# Patient Record
Sex: Male | Born: 1985 | Race: White | Hispanic: No | Marital: Single | Smoking: Never smoker
Health system: Southern US, Community
[De-identification: ages and names within clinical notes are randomized; demographics above are authoritative.]

## PROBLEM LIST (undated history)

## (undated) DIAGNOSIS — R569 Unspecified convulsions: Secondary | ICD-10-CM

## (undated) DIAGNOSIS — F79 Unspecified intellectual disabilities: Secondary | ICD-10-CM

## (undated) HISTORY — DX: Unspecified convulsions: R56.9

## (undated) HISTORY — DX: Unspecified intellectual disabilities: F79

## (undated) HISTORY — PX: FINGER SURGERY: SHX640

---
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
## 2009-11-06 ENCOUNTER — Encounter: Admission: RE | Admit: 2009-11-06 | Discharge: 2009-11-06 | Payer: Self-pay | Admitting: Orthopedic Surgery

## 2009-12-22 ENCOUNTER — Encounter: Admission: RE | Admit: 2009-12-22 | Discharge: 2010-03-22 | Payer: Self-pay | Admitting: Orthopedic Surgery

## 2010-09-09 IMAGING — CT CT L SPINE W/ CM
4 of 9 series · 13 of 29 positions shown, 14 images · IV contrast (omnipaque)
Comparison: None available.

CLINICAL DATA: Low back and right lower extremity pain.

MYELOGRAM INJECTION
TECHNIQUE: Informed consent was obtained from the patient prior to
the procedure, including potential complications of headache,
allergy, infection and pain.  A timeout procedure was performed.
With the patient prone, the lower back was prepped with Betadine.
1% Lidocaine was used for local anesthesia.  Lumbar puncture was
performed at the right paramidline L2-3 level using a 22 gauge
needle with return of clear CSF.  10 ml of Omnipaque 225was
injected into the subarachnoid space .
TECHNIQUE: Following injection of intrathecal Omnipaque contrast,
spine imaging in multiple projections was performed using
fluoroscopy.
Fluoroscopy Time: 57 seconds .
TECHNIQUE: CT imaging of the lumbar spine was performed after
intrathecal contrast administration.  Multiplanar CT image
reconstructions were also generated.

[Series 2: l spine bone · axial · 0.27mm/px · z∈[-308,-184]mm · 3 of 100 slices shown, 4 images]
[im 25/100  soft-tissue]
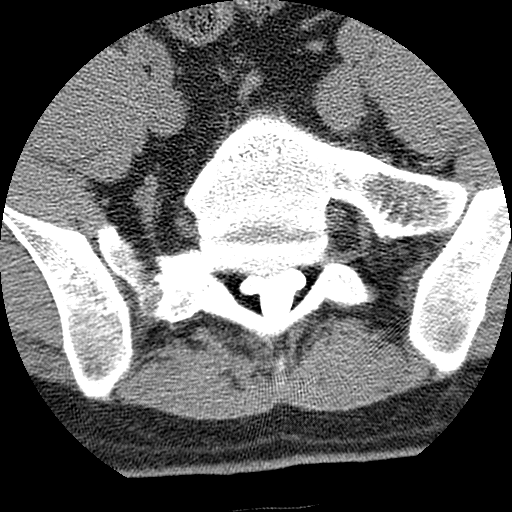
[im 25/100  bone]
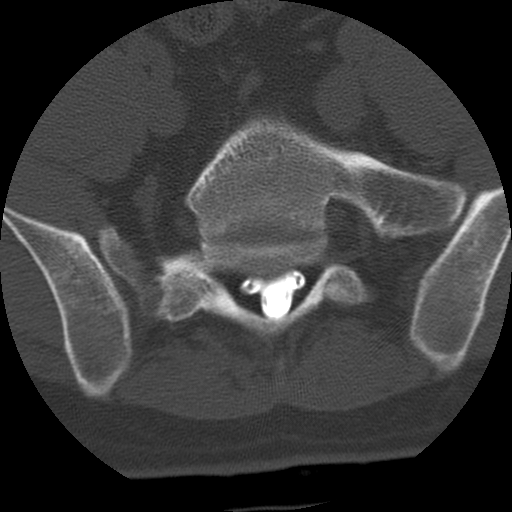
[im 50/100  bone]
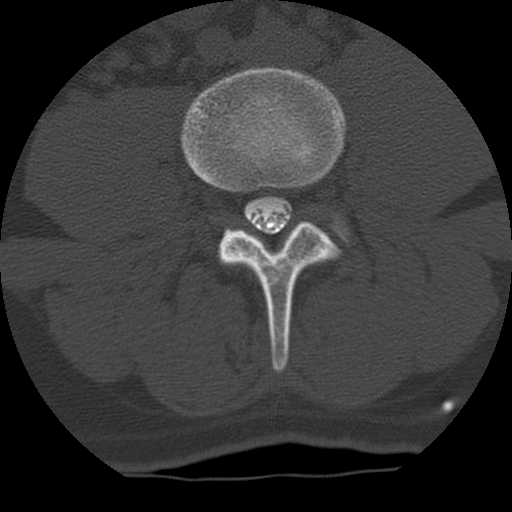
[im 75/100  bone]
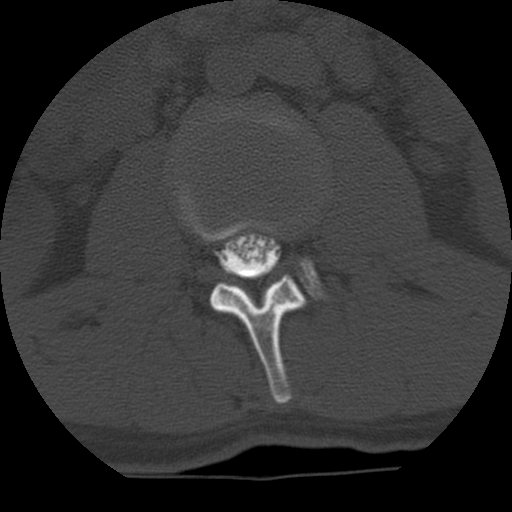

[Series 3: l spine soft · axial · 0.27mm/px · z∈[-308,-184]mm · 3 of 99 slices shown]
[im 25/99  soft-tissue]
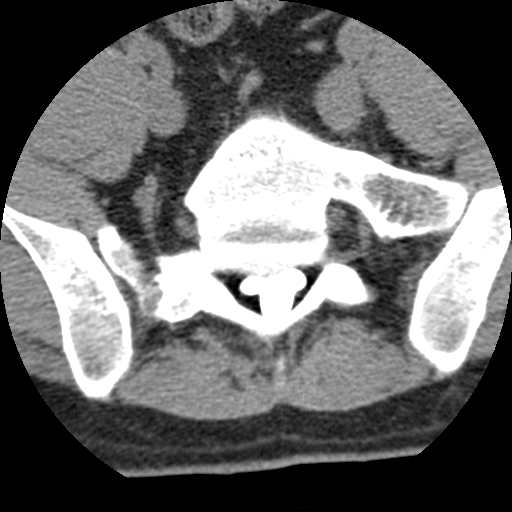
[im 50/99  soft-tissue]
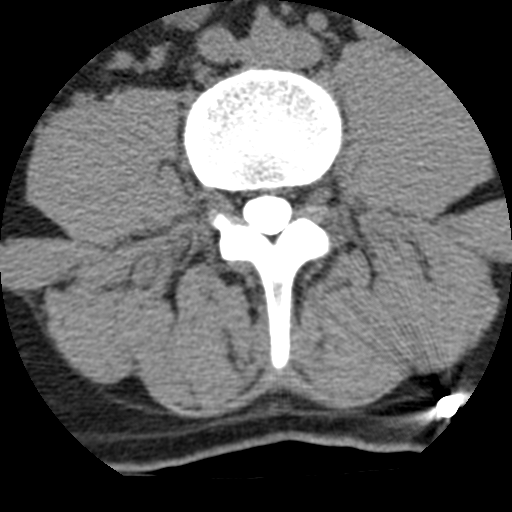
[im 74/99  soft-tissue]
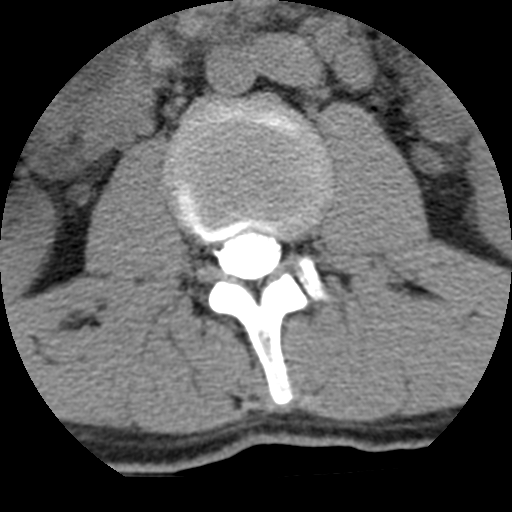

[Series 400: cor · coronal · 0.49mm/px · 6 of 40 slices shown]
[im 2/40  soft-tissue]
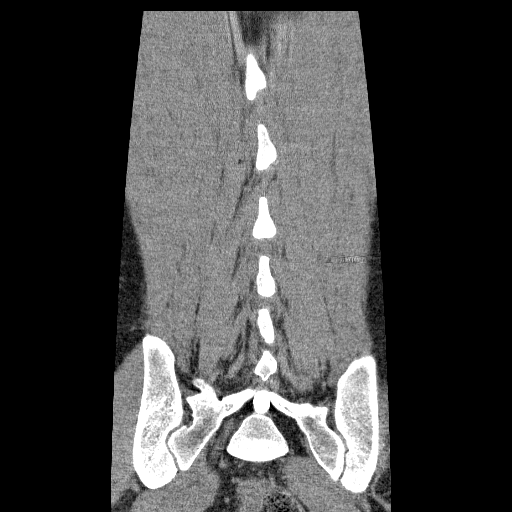
[im 7/40  bone]
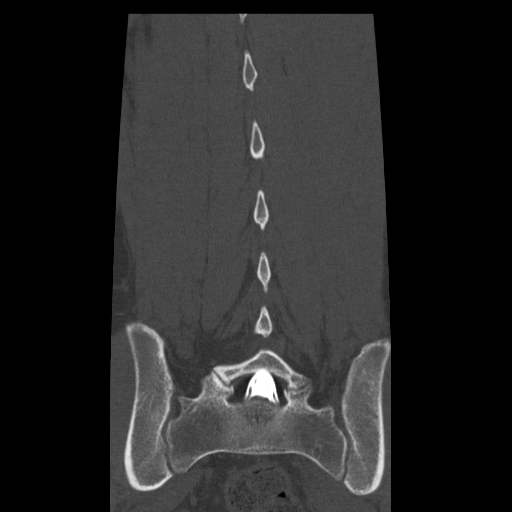
[im 14/40  bone]
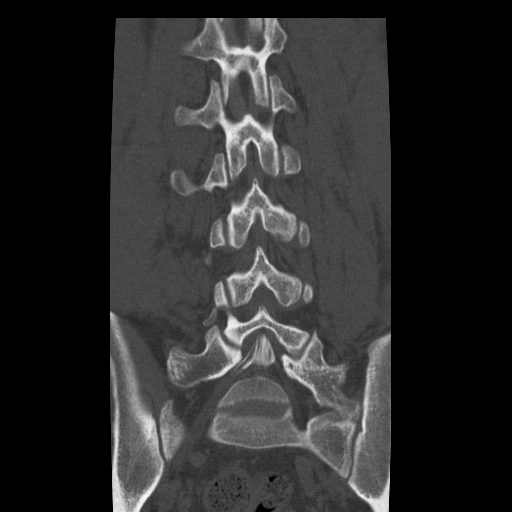
[im 20/40  bone]
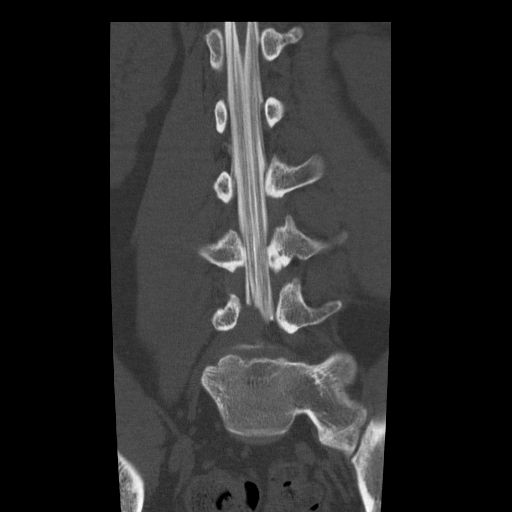
[im 27/40  bone]
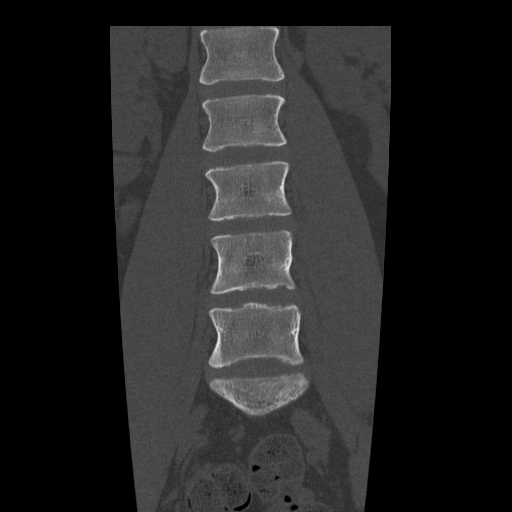
[im 33/40  bone]
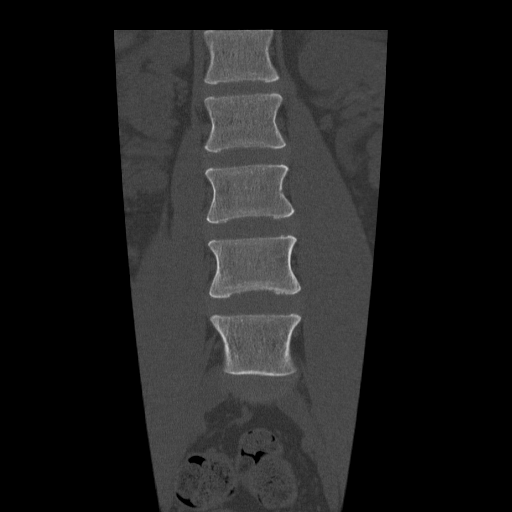

[Series 401: cor/lower level · coronal · 0.27mm/px · 1 of 40 slices shown]
[im 39/40  bone]
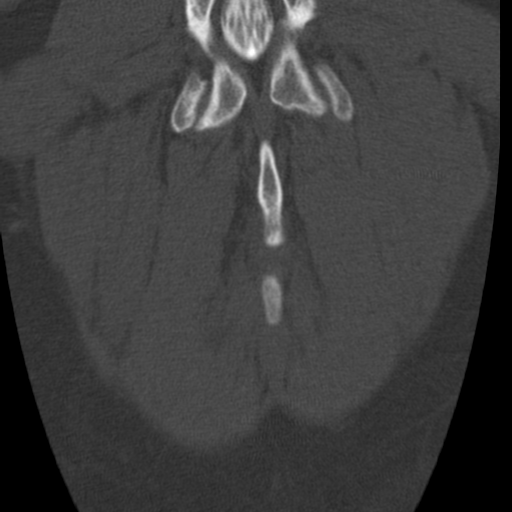

[13 of 29 positions shown; findings below may reference images not displayed]

IMPRESSION: Successful injection of  intrathecal contrast for myelography.

MYELOGRAM LUMBAR
FINDINGS: Five lumbar-type vertebral bodies are present.  A
transitional S1 segment is present.  There is a large filling
defect posterior to the L5 vertebral body.  There is abrupt cutoff
of the right L5 nerve root at the L4-5 level.  There is significant
medial displacement of the right S1 nerve root.  The upper nerve
roots fill normally.  The left sided nerve roots fill normally.
The vertebral body heights and alignment are maintained.

Standing images demonstrate no significant change in alignment.
Alignment is stable through flexion and extension.
IMPRESSION: 1.  Large right lateral filling defect posterior to the L5
vertebral body creating significant mass effect on the right L5 and
S1 nerve roots.
2.  Transitional S1 segment.

CT MYELOGRAPHY LUMBAR SPINE
FINDINGS: As stated above, there is a transitional S1 segment.
The conus medullaris terminates the vertebral body heights
alignment are maintained.  The conus medullaris terminates at the
L1 level.  The disc levels at L3-4 and above are normal.

L4-5:  Rightward disc bulging is present.  There is mild facet
hypertrophy and ligamentum flavum thickening.  Mild right foraminal
narrowing is evident.  There is significant right lateral recess
narrowing just below the disc level.  It is unclear if this
represents a disc fragment from the L4-5 level or from the L5-S1
level.

L5-S1:  Broad-based disc bulging is present with significant right
lateral recess component.  There is significant displacement of the
right S1 nerve root.  The disc herniates into the foramen, creating
significant foraminal stenosis as well.  Extensive soft tissue
posterior to the L5 vertebral body on the right likely represents
extruded disc material extending superiorly from this L5-S1 disc.
Mild left foraminal narrowing is present as well.
IMPRESSION: 1.  Large soft tissue filling defect posterior to the L5 vertebral
body creating significant mass effect on the right L5 and S1 nerve
roots.  This most likely represents extruded disc material from the
L5-S1 level.
2.  Rightward disc bulging at L4-5 also narrows the right lateral
recess at that level.
3.  Moderate right foraminal stenosis at L5-S1.
4.  Transitional S1 anatomy.

## 2011-02-05 NOTE — Procedures (Signed)
REFERRING PHYSICIAN:  Ellison Carwin, M.D.   CLINICAL HISTORY:  [age]-year-old male with history of seizure 2 weeks  ago.  Medications not listed.   No clear background awake rhythm is seen.  There is a diffuse 6-7 Hz theta  slowing seen throughout the recording.  The patient does show some changes  of light sleep during the tracing but no clear wakeful pattern is seen.  No  paroxysmal epileptiform activity spikes or sharp waves identified.  Length  of the tracing is 22.3 minutes, technical component is adequate, EKG tracing  reveals regular sinus rhythm.  Hyperventilation and photic stimulation  result in no significant abnormalities.   IMPRESSION:  This EEG is slightly abnormal due to presence of excessive  sleepiness.  No definitive epileptiform features are however, noted.      ZOX:WRUE  D:  02/03/2005 19:34:57  T:  02/03/2005 20:43:03  Job #:  [id number]   cc:   Deanna Artis. Sharene Skeans, M.D.  [address]  [address]  [place]  [place] [address]  Fax: [phone]

## 2013-01-26 ENCOUNTER — Other Ambulatory Visit: Payer: Self-pay

## 2013-01-26 MED ORDER — LAMOTRIGINE 100 MG PO TABS: 200.0000 mg | ORAL_TABLET | Freq: Two times a day (BID) | ORAL | Status: DC

## 2013-01-26 MED ORDER — CARBAMAZEPINE ER 200 MG PO CP12: 400.0000 mg | ORAL_CAPSULE | Freq: Two times a day (BID) | ORAL | Status: DC

## 2013-02-06 ENCOUNTER — Other Ambulatory Visit: Payer: Self-pay

## 2013-02-06 MED ORDER — LAMOTRIGINE 100 MG PO TABS: 200.0000 mg | ORAL_TABLET | Freq: Two times a day (BID) | ORAL | Status: DC

## 2013-02-06 MED ORDER — CARBAMAZEPINE ER 200 MG PO CP12: 400.0000 mg | ORAL_CAPSULE | Freq: Two times a day (BID) | ORAL | Status: DC

## 2016-07-28 ENCOUNTER — Encounter (INDEPENDENT_AMBULATORY_CARE_PROVIDER_SITE_OTHER): Payer: Self-pay

## 2016-07-28 ENCOUNTER — Ambulatory Visit (INDEPENDENT_AMBULATORY_CARE_PROVIDER_SITE_OTHER): Payer: Medicaid Other

## 2016-07-28 DIAGNOSIS — Z23 Encounter for immunization: Secondary | ICD-10-CM | POA: Diagnosis not present

## 2016-09-22 ENCOUNTER — Ambulatory Visit (INDEPENDENT_AMBULATORY_CARE_PROVIDER_SITE_OTHER): Payer: Medicaid Other | Admitting: Physician Assistant

## 2016-09-22 ENCOUNTER — Encounter: Payer: Self-pay | Admitting: Physician Assistant

## 2016-09-22 ENCOUNTER — Encounter (INDEPENDENT_AMBULATORY_CARE_PROVIDER_SITE_OTHER): Payer: Self-pay

## 2016-09-22 VITALS — BP 111/62 | HR 61 | Temp 96.8°F | Ht 70.0 in | Wt 137.4 lb

## 2016-09-22 DIAGNOSIS — Z0000 Encounter for general adult medical examination without abnormal findings: Secondary | ICD-10-CM | POA: Diagnosis not present

## 2016-09-22 DIAGNOSIS — E559 Vitamin D deficiency, unspecified: Secondary | ICD-10-CM

## 2016-09-22 DIAGNOSIS — G40909 Epilepsy, unspecified, not intractable, without status epilepticus: Secondary | ICD-10-CM | POA: Insufficient documentation

## 2016-09-22 NOTE — Progress Notes (Signed)
BP 111/62   Pulse 61   Temp (!) 96.8 F (36 C) (Oral)   Ht 5' 10"  (1.778 m)   Wt 137 lb 6.4 oz (62.3 kg)   BMI 19.71 kg/m    Subjective:    Patient ID: Colton Bruce, male    DOB: Aug 15, 1986, 31 y.o.   MRN: [id number]  Rishikesh A Brannan is a 31 y.o. male presenting on 09/22/2016 for Annual Exam  HPI Patient here to be established as new patient at [facility].  This patient is known to me from [facility]. This patient comes in for annual well physical examination. All medications are reviewed today. There are no reports of any problems with the medications. All of the medical conditions are reviewed and updated.  Lab work is reviewed and will be ordered as medically necessary. There are no new problems reported with today's visit.  Patient reports doing well overall.  Sees Dr. Merlene Laughter for seizure disorder and is well controlled, all medications are prescribef through his office. Goes twice a year. Patient's grandfather in room during exam.  Past Medical History:  Diagnosis Date  . Mental retardation   . Seizures ([place])     Relevant past medical, surgical, family and social history reviewed and updated as indicated. Interim medical history since our last visit reviewed. Allergies and medications reviewed and updated.   Data reviewed from any sources in EPIC.  Review of Systems  Constitutional: Negative.  Negative for appetite change and fatigue.  HENT: Negative.   Eyes: Negative.  Negative for pain and visual disturbance.  Respiratory: Negative.  Negative for cough, chest tightness, shortness of breath and wheezing.   Cardiovascular: Negative.  Negative for chest pain, palpitations and leg swelling.  Gastrointestinal: Negative.  Negative for abdominal pain, diarrhea, nausea and vomiting.  Endocrine: Negative.   Genitourinary: Negative.   Musculoskeletal: Negative.   Skin: Negative.  Negative for color change and rash.  Neurological:  Negative.  Negative for weakness, numbness and headaches.  Psychiatric/Behavioral: Negative.      Social History   Social History  . Marital status: Single    Spouse name: N/A  . Number of children: N/A  . Years of education: N/A   Occupational History  . Not on file.   Social History Main Topics  . Smoking status: Never Smoker  . Smokeless tobacco: Never Used  . Alcohol use No  . Drug use: No  . Sexual activity: Not on file   Other Topics Concern  . Not on file   Social History Narrative  . No narrative on file    Past Surgical History:  Procedure Laterality Date  . FINGER SURGERY      History reviewed. No pertinent family history.  Allergies as of 09/22/2016      Reactions   Penicillins Other (See Comments)   seizure      Medication List       Accurate as of 09/22/16 11:14 AM. Always use your most recent med list.          carbamazepine 200 MG 12 hr tablet Commonly known as:  TEGRETOL XR Take 400 mg by mouth 2 (two) times daily.   LAMICTAL XR 200 MG Tb24 Generic drug:  LamoTRIgine XR Take 400 mg by mouth daily.   Vitamin D (Ergocalciferol) 50000 units Caps capsule Commonly known as:  DRISDOL Take 50,000 Units by mouth every 7 (seven) days.          Objective:  BP 111/62   Pulse 61   Temp (!) 96.8 F (36 C) (Oral)   Ht 5' 10"  (1.778 m)   Wt 137 lb 6.4 oz (62.3 kg)   BMI 19.71 kg/m   Allergies  Allergen Reactions  . Penicillins Other (See Comments)    seizure   Wt Readings from Last 3 Encounters:  09/22/16 137 lb 6.4 oz (62.3 kg)    Physical Exam  Constitutional: He appears well-developed and well-nourished.  HENT:  Head: Normocephalic and atraumatic.  Eyes: Conjunctivae and EOM are normal. Pupils are equal, round, and reactive to light.  Neck: Normal range of motion. Neck supple.  Cardiovascular: Normal rate, regular rhythm and normal heart sounds.   Pulmonary/Chest: Effort normal and breath sounds normal.  Abdominal: Soft.  Bowel sounds are normal.  Musculoskeletal: Normal range of motion.  Skin: Skin is warm and dry.  Nursing note and vitals reviewed.   No results found for this or any previous visit.    Assessment & Plan:   1. Well adult exam - CMP14+EGFR - VITAMIN D 25 Hydroxy (Vit-D Deficiency, Fractures) - CBC with Differential/Platelet  2. Vitamin D deficiency - Vitamin D, Ergocalciferol, (DRISDOL) 50000 units CAPS capsule; Take 50,000 Units by mouth every 7 (seven) days. - VITAMIN D 25 Hydroxy (Vit-D Deficiency, Fractures)  3. Seizure disorder (HCC) - LamoTRIgine XR (LAMICTAL XR) 200 MG TB24; Take 400 mg by mouth daily. - carbamazepine (TEGRETOL XR) 200 MG 12 hr tablet; Take 400 mg by mouth 2 (two) times daily. - CMP14+EGFR - CBC with Differential/Platelet  4. 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 Educational handout given for health maintenance  Follow up plan: Return in about 1 year (around 09/22/2017) for well check.  Terald Sleeper PA-C [facility] [address]  [place], [place] [address] [phone]   09/22/2016, 11:14 AM

## 2016-09-22 NOTE — Patient Instructions (Signed)

## 2016-09-23 LAB — CBC WITH DIFFERENTIAL/PLATELET
BASOS: 1 %
Basophils Absolute: 0 10*3/uL (ref 0.0–0.2)
EOS (ABSOLUTE): 0.1 10*3/uL (ref 0.0–0.4)
Eos: 1 %
Hematocrit: 38.6 % (ref 37.5–51.0)
Hemoglobin: 13.3 g/dL (ref 13.0–17.7)
IMMATURE GRANS (ABS): 0 10*3/uL (ref 0.0–0.1)
IMMATURE GRANULOCYTES: 0 %
LYMPHS: 30 %
Lymphocytes Absolute: 1.8 10*3/uL (ref 0.7–3.1)
MCH: 29.8 pg (ref 26.6–33.0)
MCHC: 34.5 g/dL (ref 31.5–35.7)
MCV: 87 fL (ref 79–97)
Monocytes Absolute: 0.7 10*3/uL (ref 0.1–0.9)
Monocytes: 11 %
NEUTROS PCT: 57 %
Neutrophils Absolute: 3.5 10*3/uL (ref 1.4–7.0)
PLATELETS: 280 10*3/uL (ref 150–379)
RBC: 4.46 x10E6/uL (ref 4.14–5.80)
RDW: 14.1 % (ref 12.3–15.4)
WBC: 6.2 10*3/uL (ref 3.4–10.8)

## 2016-09-23 LAB — CMP14+EGFR
ALT: 13 IU/L (ref 0–44)
AST: 12 IU/L (ref 0–40)
Albumin/Globulin Ratio: 1.6 (ref 1.2–2.2)
Albumin: 4.8 g/dL (ref 3.5–5.5)
Alkaline Phosphatase: 103 IU/L (ref 39–117)
BILIRUBIN TOTAL: 0.2 mg/dL (ref 0.0–1.2)
BUN/Creatinine Ratio: 16 (ref 9–20)
BUN: 13 mg/dL (ref 6–20)
CALCIUM: 9.6 mg/dL (ref 8.7–10.2)
CHLORIDE: 97 mmol/L (ref 96–106)
CO2: 27 mmol/L (ref 18–29)
Creatinine, Ser: 0.8 mg/dL (ref 0.76–1.27)
GFR, EST AFRICAN AMERICAN: 140 mL/min/{1.73_m2} (ref >59)
GFR, EST NON AFRICAN AMERICAN: 121 mL/min/{1.73_m2} (ref >59)
GLUCOSE: 84 mg/dL (ref 65–99)
Globulin, Total: 3 g/dL (ref 1.5–4.5)
Potassium: 4.6 mmol/L (ref 3.5–5.2)
Sodium: 139 mmol/L (ref 134–144)
TOTAL PROTEIN: 7.8 g/dL (ref 6.0–8.5)

## 2016-09-23 LAB — VITAMIN D 25 HYDROXY (VIT D DEFICIENCY, FRACTURES): Vit D, 25-Hydroxy: 52 ng/mL (ref 30.0–100.0)

## 2017-02-18 ENCOUNTER — Other Ambulatory Visit: Payer: Self-pay | Admitting: Physician Assistant

## 2017-07-25 ENCOUNTER — Ambulatory Visit (INDEPENDENT_AMBULATORY_CARE_PROVIDER_SITE_OTHER): Payer: Medicaid Other | Admitting: *Deleted

## 2017-07-25 DIAGNOSIS — Z23 Encounter for immunization: Secondary | ICD-10-CM

## 2017-08-19 ENCOUNTER — Other Ambulatory Visit: Payer: Self-pay | Admitting: Physician Assistant

## 2017-08-19 NOTE — Telephone Encounter (Signed)
Last seen 09/22/16  Colton Bruce

## 2017-10-04 ENCOUNTER — Encounter: Payer: Self-pay | Admitting: Physician Assistant

## 2017-10-04 ENCOUNTER — Ambulatory Visit (INDEPENDENT_AMBULATORY_CARE_PROVIDER_SITE_OTHER): Payer: Medicaid Other | Admitting: Physician Assistant

## 2017-10-04 VITALS — BP 118/75 | HR 70 | Temp 96.8°F | Ht 70.0 in | Wt 132.2 lb

## 2017-10-04 DIAGNOSIS — K5909 Other constipation: Secondary | ICD-10-CM | POA: Insufficient documentation

## 2017-10-04 DIAGNOSIS — Z0000 Encounter for general adult medical examination without abnormal findings: Secondary | ICD-10-CM

## 2017-10-04 DIAGNOSIS — E559 Vitamin D deficiency, unspecified: Secondary | ICD-10-CM

## 2017-10-04 MED ORDER — LACTULOSE 10 GM/15ML PO SOLN
ORAL | 6 refills | Status: DC
Start: 2017-10-04 — End: 2018-10-18

## 2017-10-04 MED ORDER — VITAMIN D (ERGOCALCIFEROL) 1.25 MG (50000 UNIT) PO CAPS: 50000.0000 [IU] | ORAL_CAPSULE | ORAL | 3 refills | Status: DC

## 2017-10-04 NOTE — Patient Instructions (Signed)
In a few days you may receive a survey in the mail or online from [employer] regarding your visit with us today. Please take a moment to fill this out. Your feedback is very important to our whole office. It can help us better understand your needs as well as improve your experience and satisfaction. Thank you for taking your time to complete it. We care about you.  Mannat Benedetti, PA-C  

## 2017-10-04 NOTE — Progress Notes (Signed)
BP 118/75   Pulse 70   Temp (!) 96.8 F (36 C) (Oral)   Ht 5' 10"  (1.778 m)   Wt 132 lb 3.2 oz (60 kg)   BMI 18.97 kg/m    Subjective:    Patient ID: Colton Bruce, male    DOB: Jan 12, 1986, 32 y.o.   MRN: [id number]  HPI: Colton Bruce is a 32 y.o. male presenting on 10/04/2017 for Annual Exam (would like lab work also ) This patient comes in for annual well physical examination. All medications are reviewed today. There are no reports of any problems with the medications. All of the medical conditions are reviewed and updated.  Lab work is reviewed and will be ordered as medically necessary. There are no new problems reported with today's visit.  Patient reports doing well overall.  Patient still sees neurology for his seizure disorder.  He still gets medications from him.  At this time he is feeling very good with that.  Relevant past medical, surgical, family and social history reviewed and updated as indicated. Allergies and medications reviewed and updated.  Past Medical History:  Diagnosis Date  . Mental retardation   . Seizures ([facility])     Past Surgical History:  Procedure Laterality Date  . FINGER SURGERY      Review of Systems  Constitutional: Negative.  Negative for appetite change and fatigue.  HENT: Negative.   Eyes: Negative.  Negative for pain and visual disturbance.  Respiratory: Negative.  Negative for cough, chest tightness, shortness of breath and wheezing.   Cardiovascular: Negative.  Negative for chest pain, palpitations and leg swelling.  Gastrointestinal: Negative.  Negative for abdominal pain, diarrhea, nausea and vomiting.  Endocrine: Negative.   Genitourinary: Negative.   Musculoskeletal: Negative.   Skin: Negative.  Negative for color change and rash.  Neurological: Negative.  Negative for weakness, numbness and headaches.  Psychiatric/Behavioral: Negative.     Allergies as of 10/04/2017      Reactions   Penicillins Other (See Comments)     seizure      Medication List        Accurate as of 10/04/17  2:08 PM. Always use your most recent med list.          carbamazepine 200 MG 12 hr tablet Commonly known as:  TEGRETOL XR Take 400 mg by mouth 2 (two) times daily.   lactulose 10 GM/15ML solution Commonly known as:  CHRONULAC TAKE 2 TABLESPOONFULS AT BEDTIME   LAMICTAL XR 200 MG Tb24 24 hour tablet Generic drug:  LamoTRIgine Take 400 mg by mouth daily.   Vitamin D (Ergocalciferol) 50000 units Caps capsule Commonly known as:  DRISDOL Take 1 capsule (50,000 Units total) by mouth every 7 (seven) days.          Objective:    BP 118/75   Pulse 70   Temp (!) 96.8 F (36 C) (Oral)   Ht 5' 10"  (1.778 m)   Wt 132 lb 3.2 oz (60 kg)   BMI 18.97 kg/m   Allergies  Allergen Reactions  . Penicillins Other (See Comments)    seizure    Physical Exam  Constitutional: He appears well-developed and well-nourished.  HENT:  Head: Normocephalic and atraumatic.  Eyes: Conjunctivae and EOM are normal. Pupils are equal, round, and reactive to light.  Neck: Normal range of motion. Neck supple.  Cardiovascular: Normal rate, regular rhythm and normal heart sounds.  Pulmonary/Chest: Effort normal and breath sounds normal.  Abdominal: Soft.  Bowel sounds are normal.  Musculoskeletal: Normal range of motion.  Skin: Skin is warm and dry.  Nursing note and vitals reviewed.   Results for orders placed or performed in visit on 09/22/16  CMP14+EGFR  Result Value Ref Range   Glucose 84 65 - 99 mg/dL   BUN 13 6 - 20 mg/dL   Creatinine, Ser 0.80 0.76 - 1.27 mg/dL   GFR calc non Af Amer 121 >59 mL/min/1.73   GFR calc Af Amer 140 >59 mL/min/1.73   BUN/Creatinine Ratio 16 9 - 20   Sodium 139 134 - 144 mmol/L   Potassium 4.6 3.5 - 5.2 mmol/L   Chloride 97 96 - 106 mmol/L   CO2 27 18 - 29 mmol/L   Calcium 9.6 8.7 - 10.2 mg/dL   Total Protein 7.8 6.0 - 8.5 g/dL   Albumin 4.8 3.5 - 5.5 g/dL   Globulin, Total 3.0 1.5 - 4.5  g/dL   Albumin/Globulin Ratio 1.6 1.2 - 2.2   Bilirubin Total 0.2 0.0 - 1.2 mg/dL   Alkaline Phosphatase 103 39 - 117 IU/L   AST 12 0 - 40 IU/L   ALT 13 0 - 44 IU/L  VITAMIN D 25 Hydroxy (Vit-D Deficiency, Fractures)  Result Value Ref Range   Vit D, 25-Hydroxy 52.0 30.0 - 100.0 ng/mL  CBC with Differential/Platelet  Result Value Ref Range   WBC 6.2 3.4 - 10.8 x10E3/uL   RBC 4.46 4.14 - 5.80 x10E6/uL   Hemoglobin 13.3 13.0 - 17.7 g/dL   Hematocrit 38.6 37.5 - 51.0 %   MCV 87 79 - 97 fL   MCH 29.8 26.6 - 33.0 pg   MCHC 34.5 31.5 - 35.7 g/dL   RDW 14.1 12.3 - 15.4 %   Platelets 280 150 - 379 x10E3/uL   Neutrophils 57 Not Estab. %   Lymphs 30 Not Estab. %   Monocytes 11 Not Estab. %   Eos 1 Not Estab. %   Basos 1 Not Estab. %   Neutrophils Absolute 3.5 1.4 - 7.0 x10E3/uL   Lymphocytes Absolute 1.8 0.7 - 3.1 x10E3/uL   Monocytes Absolute 0.7 0.1 - 0.9 x10E3/uL   EOS (ABSOLUTE) 0.1 0.0 - 0.4 x10E3/uL   Basophils Absolute 0.0 0.0 - 0.2 x10E3/uL   Immature Granulocytes 0 Not Estab. %   Immature Grans (Abs) 0.0 0.0 - 0.1 x10E3/uL      Assessment & Plan:   1. Well adult exam - CBC with Differential/Platelet - CMP14+EGFR - Lipid panel - TSH  2. Vitamin D deficiency - Vitamin D, Ergocalciferol, (DRISDOL) 50000 units CAPS capsule; Take 1 capsule (50,000 Units total) by mouth every 7 (seven) days.  Dispense: 12 capsule; Refill: 3  3. Chronic constipation - lactulose (CHRONULAC) 10 GM/15ML solution; TAKE 2 TABLESPOONFULS AT BEDTIME  Dispense: 480 mL; Refill: 6    Current Outpatient Medications:  .  carbamazepine (TEGRETOL XR) 200 MG 12 hr tablet, Take 400 mg by mouth 2 (two) times daily., Disp: , Rfl:  .  lactulose (CHRONULAC) 10 GM/15ML solution, TAKE 2 TABLESPOONFULS AT BEDTIME, Disp: 480 mL, Rfl: 6 .  LamoTRIgine XR (LAMICTAL XR) 200 MG TB24, Take 400 mg by mouth daily., Disp: , Rfl:  .  Vitamin D, Ergocalciferol, (DRISDOL) 50000 units CAPS capsule, Take 1 capsule (50,000  Units total) by mouth every 7 (seven) days., Disp: 12 capsule, Rfl: 3 Continue all other maintenance medications as listed above.  Follow up plan: Recheck one year  Educational handout given for [employer]  Adah Salvage PA-C [facility] [address]  [place], [place] [address] [phone]   10/04/2017, 2:08 PM

## 2017-10-05 LAB — LIPID PANEL
CHOLESTEROL TOTAL: 162 mg/dL (ref 100–199)
Chol/HDL Ratio: 3.4 ratio (ref 0.0–5.0)
HDL: 48 mg/dL (ref >39)
LDL CALC: 104 mg/dL — AB (ref 0–99)
TRIGLYCERIDES: 50 mg/dL (ref 0–149)
VLDL Cholesterol Cal: 10 mg/dL (ref 5–40)

## 2017-10-05 LAB — CBC WITH DIFFERENTIAL/PLATELET
Basophils Absolute: 0 10*3/uL (ref 0.0–0.2)
Basos: 1 %
EOS (ABSOLUTE): 0 10*3/uL (ref 0.0–0.4)
EOS: 1 %
HEMATOCRIT: 40.9 % (ref 37.5–51.0)
HEMOGLOBIN: 14.1 g/dL (ref 13.0–17.7)
Immature Grans (Abs): 0 10*3/uL (ref 0.0–0.1)
Immature Granulocytes: 0 %
Lymphocytes Absolute: 1.6 10*3/uL (ref 0.7–3.1)
Lymphs: 30 %
MCH: 30.9 pg (ref 26.6–33.0)
MCHC: 34.5 g/dL (ref 31.5–35.7)
MCV: 90 fL (ref 79–97)
MONOCYTES: 10 %
MONOS ABS: 0.5 10*3/uL (ref 0.1–0.9)
NEUTROS ABS: 3.1 10*3/uL (ref 1.4–7.0)
Neutrophils: 58 %
Platelets: 335 10*3/uL (ref 150–379)
RBC: 4.57 x10E6/uL (ref 4.14–5.80)
RDW: 13.5 % (ref 12.3–15.4)
WBC: 5.3 10*3/uL (ref 3.4–10.8)

## 2017-10-05 LAB — CMP14+EGFR
A/G RATIO: 1.8 (ref 1.2–2.2)
ALBUMIN: 5.3 g/dL (ref 3.5–5.5)
ALT: 15 IU/L (ref 0–44)
AST: 14 IU/L (ref 0–40)
Alkaline Phosphatase: 85 IU/L (ref 39–117)
BILIRUBIN TOTAL: 0.3 mg/dL (ref 0.0–1.2)
BUN / CREAT RATIO: 18 (ref 9–20)
BUN: 16 mg/dL (ref 6–20)
CHLORIDE: 98 mmol/L (ref 96–106)
CO2: 28 mmol/L (ref 20–29)
CREATININE: 0.87 mg/dL (ref 0.76–1.27)
Calcium: 9.9 mg/dL (ref 8.7–10.2)
GFR calc Af Amer: 133 mL/min/{1.73_m2} (ref >59)
GFR calc non Af Amer: 115 mL/min/{1.73_m2} (ref >59)
Globulin, Total: 2.9 g/dL (ref 1.5–4.5)
Glucose: 94 mg/dL (ref 65–99)
POTASSIUM: 5.2 mmol/L (ref 3.5–5.2)
SODIUM: 140 mmol/L (ref 134–144)
Total Protein: 8.2 g/dL (ref 6.0–8.5)

## 2017-10-05 LAB — TSH: TSH: 1.98 u[IU]/mL (ref 0.450–4.500)

## 2018-04-18 DIAGNOSIS — K5904 Chronic idiopathic constipation: Secondary | ICD-10-CM | POA: Diagnosis not present

## 2018-04-18 DIAGNOSIS — G40219 Localization-related (focal) (partial) symptomatic epilepsy and epileptic syndromes with complex partial seizures, intractable, without status epilepticus: Secondary | ICD-10-CM | POA: Diagnosis not present

## 2018-04-18 DIAGNOSIS — Z79899 Other long term (current) drug therapy: Secondary | ICD-10-CM | POA: Diagnosis not present

## 2018-04-18 DIAGNOSIS — G40409 Other generalized epilepsy and epileptic syndromes, not intractable, without status epilepticus: Secondary | ICD-10-CM | POA: Diagnosis not present

## 2018-05-04 DIAGNOSIS — G40919 Epilepsy, unspecified, intractable, without status epilepticus: Secondary | ICD-10-CM | POA: Diagnosis not present

## 2018-05-04 DIAGNOSIS — Z79899 Other long term (current) drug therapy: Secondary | ICD-10-CM | POA: Diagnosis not present

## 2018-05-17 DIAGNOSIS — G40409 Other generalized epilepsy and epileptic syndromes, not intractable, without status epilepticus: Secondary | ICD-10-CM | POA: Diagnosis not present

## 2018-05-17 DIAGNOSIS — G40219 Localization-related (focal) (partial) symptomatic epilepsy and epileptic syndromes with complex partial seizures, intractable, without status epilepticus: Secondary | ICD-10-CM | POA: Diagnosis not present

## 2018-05-17 DIAGNOSIS — K5904 Chronic idiopathic constipation: Secondary | ICD-10-CM | POA: Diagnosis not present

## 2018-05-17 DIAGNOSIS — Z79899 Other long term (current) drug therapy: Secondary | ICD-10-CM | POA: Diagnosis not present

## 2018-06-15 DIAGNOSIS — G40409 Other generalized epilepsy and epileptic syndromes, not intractable, without status epilepticus: Secondary | ICD-10-CM | POA: Diagnosis not present

## 2018-06-15 DIAGNOSIS — Z79899 Other long term (current) drug therapy: Secondary | ICD-10-CM | POA: Diagnosis not present

## 2018-06-15 DIAGNOSIS — K5904 Chronic idiopathic constipation: Secondary | ICD-10-CM | POA: Diagnosis not present

## 2018-06-15 DIAGNOSIS — G40219 Localization-related (focal) (partial) symptomatic epilepsy and epileptic syndromes with complex partial seizures, intractable, without status epilepticus: Secondary | ICD-10-CM | POA: Diagnosis not present

## 2018-07-10 DIAGNOSIS — H04123 Dry eye syndrome of bilateral lacrimal glands: Secondary | ICD-10-CM | POA: Diagnosis not present

## 2018-07-10 DIAGNOSIS — H21233 Degeneration of iris (pigmentary), bilateral: Secondary | ICD-10-CM | POA: Diagnosis not present

## 2018-07-10 DIAGNOSIS — H401331 Pigmentary glaucoma, bilateral, mild stage: Secondary | ICD-10-CM | POA: Diagnosis not present

## 2018-07-10 DIAGNOSIS — H5213 Myopia, bilateral: Secondary | ICD-10-CM | POA: Diagnosis not present

## 2018-07-13 DIAGNOSIS — G40409 Other generalized epilepsy and epileptic syndromes, not intractable, without status epilepticus: Secondary | ICD-10-CM | POA: Diagnosis not present

## 2018-07-13 DIAGNOSIS — Z79899 Other long term (current) drug therapy: Secondary | ICD-10-CM | POA: Diagnosis not present

## 2018-07-13 DIAGNOSIS — K5904 Chronic idiopathic constipation: Secondary | ICD-10-CM | POA: Diagnosis not present

## 2018-07-13 DIAGNOSIS — G40219 Localization-related (focal) (partial) symptomatic epilepsy and epileptic syndromes with complex partial seizures, intractable, without status epilepticus: Secondary | ICD-10-CM | POA: Diagnosis not present

## 2018-09-07 DIAGNOSIS — K5904 Chronic idiopathic constipation: Secondary | ICD-10-CM | POA: Diagnosis not present

## 2018-09-07 DIAGNOSIS — Z79899 Other long term (current) drug therapy: Secondary | ICD-10-CM | POA: Diagnosis not present

## 2018-09-07 DIAGNOSIS — G40409 Other generalized epilepsy and epileptic syndromes, not intractable, without status epilepticus: Secondary | ICD-10-CM | POA: Diagnosis not present

## 2018-09-07 DIAGNOSIS — G40219 Localization-related (focal) (partial) symptomatic epilepsy and epileptic syndromes with complex partial seizures, intractable, without status epilepticus: Secondary | ICD-10-CM | POA: Diagnosis not present

## 2018-10-18 ENCOUNTER — Ambulatory Visit: Payer: Medicaid Other | Admitting: Physician Assistant

## 2018-10-18 ENCOUNTER — Encounter: Payer: Self-pay | Admitting: Physician Assistant

## 2018-10-18 VITALS — BP 116/68 | HR 72 | Temp 96.8°F | Ht 70.0 in | Wt 127.0 lb

## 2018-10-18 DIAGNOSIS — Z23 Encounter for immunization: Secondary | ICD-10-CM | POA: Diagnosis not present

## 2018-10-18 DIAGNOSIS — Z0000 Encounter for general adult medical examination without abnormal findings: Secondary | ICD-10-CM | POA: Diagnosis not present

## 2018-10-18 DIAGNOSIS — K5909 Other constipation: Secondary | ICD-10-CM | POA: Diagnosis not present

## 2018-10-18 DIAGNOSIS — Z0001 Encounter for general adult medical examination with abnormal findings: Secondary | ICD-10-CM

## 2018-10-18 DIAGNOSIS — E559 Vitamin D deficiency, unspecified: Secondary | ICD-10-CM | POA: Diagnosis not present

## 2018-10-18 MED ORDER — LACTULOSE 10 GM/15ML PO SOLN: ORAL | 6 refills | Status: DC

## 2018-10-18 MED ORDER — VITAMIN D (ERGOCALCIFEROL) 1.25 MG (50000 UNIT) PO CAPS: 50000.0000 [IU] | ORAL_CAPSULE | ORAL | 3 refills | Status: DC

## 2018-10-18 NOTE — Progress Notes (Signed)
BP 116/68   Pulse 72   Temp (!) 96.8 F (36 C) (Oral)   Ht 5' 10" (1.778 m)   Wt 127 lb (57.6 kg)   BMI 18.22 kg/m    Subjective:    Patient ID: Colton Bruce, male    DOB: 07/05/1986, 33 y.o.   MRN: [id number]  HPI: Colton Bruce is a 33 y.o. male presenting on 10/18/2018 for Annual Exam  This patient comes in for annual well physical examination. All medications are reviewed today. There are no reports of any problems with the medications. All of the medical conditions are reviewed and updated.  Lab work is reviewed and will be ordered as medically necessary. There are no new problems reported with today's visit.  Patient reports doing well overall.   Past Medical History:  Diagnosis Date  . Mental retardation   . Seizures ([facility])    Relevant past medical, surgical, family and social history reviewed and updated as indicated. Interim medical history since our last visit reviewed. Allergies and medications reviewed and updated. DATA REVIEWED: CHART IN EPIC  Family History reviewed for pertinent findings.  Review of Systems  Constitutional: Negative.  Negative for appetite change and fatigue.  HENT: Negative.   Eyes: Negative.  Negative for pain and visual disturbance.  Respiratory: Negative.  Negative for cough, chest tightness, shortness of breath and wheezing.   Cardiovascular: Negative.  Negative for chest pain, palpitations and leg swelling.  Gastrointestinal: Negative.  Negative for abdominal pain, diarrhea, nausea and vomiting.  Endocrine: Negative.   Genitourinary: Negative.   Musculoskeletal: Negative.   Skin: Negative.  Negative for color change and rash.  Neurological: Negative.  Negative for weakness, numbness and headaches.  Psychiatric/Behavioral: Negative.     Allergies as of 10/18/2018      Reactions   Penicillins Other (See Comments)   seizure      Medication List       Accurate as of October 18, 2018  7:23 PM. Always use your most recent med  list.        BRIVIACT 100 MG Tabs Generic drug:  Brivaracetam Take 1 tablet by mouth 2 (two) times daily.   lactulose 10 GM/15ML solution Commonly known as:  CHRONULAC TAKE 2 TABLESPOONFULS AT BEDTIME   LAMICTAL XR 200 MG Tb24 24 hour tablet Generic drug:  LamoTRIgine Take 400 mg by mouth daily.   Vitamin D (Ergocalciferol) 1.25 MG (50000 UT) Caps capsule Commonly known as:  DRISDOL Take 1 capsule (50,000 Units total) by mouth every 7 (seven) days.          Objective:    BP 116/68   Pulse 72   Temp (!) 96.8 F (36 C) (Oral)   Ht 5' 10" (1.778 m)   Wt 127 lb (57.6 kg)   BMI 18.22 kg/m   Allergies  Allergen Reactions  . Penicillins Other (See Comments)    seizure    Wt Readings from Last 3 Encounters:  10/18/18 127 lb (57.6 kg)  10/04/17 132 lb 3.2 oz (60 kg)  09/22/16 137 lb 6.4 oz (62.3 kg)    Physical Exam Constitutional:      Appearance: He is well-developed.  HENT:     Head: Normocephalic and atraumatic.  Eyes:     Conjunctiva/sclera: Conjunctivae normal.     Pupils: Pupils are equal, round, and reactive to light.  Neck:     Musculoskeletal: Normal range of motion and neck supple.  Cardiovascular:     Rate  and Rhythm: Normal rate and regular rhythm.     Heart sounds: Normal heart sounds.  Pulmonary:     Effort: Pulmonary effort is normal.     Breath sounds: Normal breath sounds.  Abdominal:     General: Bowel sounds are normal.     Palpations: Abdomen is soft.  Musculoskeletal: Normal range of motion.  Skin:    General: Skin is warm and dry.     Results for orders placed or performed in visit on 10/04/17  CBC with Differential/Platelet  Result Value Ref Range   WBC 5.3 3.4 - 10.8 x10E3/uL   RBC 4.57 4.14 - 5.80 x10E6/uL   Hemoglobin 14.1 13.0 - 17.7 g/dL   Hematocrit 40.9 37.5 - 51.0 %   MCV 90 79 - 97 fL   MCH 30.9 26.6 - 33.0 pg   MCHC 34.5 31.5 - 35.7 g/dL   RDW 13.5 12.3 - 15.4 %   Platelets 335 150 - 379 x10E3/uL   Neutrophils  58 Not Estab. %   Lymphs 30 Not Estab. %   Monocytes 10 Not Estab. %   Eos 1 Not Estab. %   Basos 1 Not Estab. %   Neutrophils Absolute 3.1 1.4 - 7.0 x10E3/uL   Lymphocytes Absolute 1.6 0.7 - 3.1 x10E3/uL   Monocytes Absolute 0.5 0.1 - 0.9 x10E3/uL   EOS (ABSOLUTE) 0.0 0.0 - 0.4 x10E3/uL   Basophils Absolute 0.0 0.0 - 0.2 x10E3/uL   Immature Granulocytes 0 Not Estab. %   Immature Grans (Abs) 0.0 0.0 - 0.1 x10E3/uL  CMP14+EGFR  Result Value Ref Range   Glucose 94 65 - 99 mg/dL   BUN 16 6 - 20 mg/dL   Creatinine, Ser 0.87 0.76 - 1.27 mg/dL   GFR calc non Af Amer 115 >59 mL/min/1.73   GFR calc Af Amer 133 >59 mL/min/1.73   BUN/Creatinine Ratio 18 9 - 20   Sodium 140 134 - 144 mmol/L   Potassium 5.2 3.5 - 5.2 mmol/L   Chloride 98 96 - 106 mmol/L   CO2 28 20 - 29 mmol/L   Calcium 9.9 8.7 - 10.2 mg/dL   Total Protein 8.2 6.0 - 8.5 g/dL   Albumin 5.3 3.5 - 5.5 g/dL   Globulin, Total 2.9 1.5 - 4.5 g/dL   Albumin/Globulin Ratio 1.8 1.2 - 2.2   Bilirubin Total 0.3 0.0 - 1.2 mg/dL   Alkaline Phosphatase 85 39 - 117 IU/L   AST 14 0 - 40 IU/L   ALT 15 0 - 44 IU/L  Lipid panel  Result Value Ref Range   Cholesterol, Total 162 100 - 199 mg/dL   Triglycerides 50 0 - 149 mg/dL   HDL 48 >39 mg/dL   VLDL Cholesterol Cal 10 5 - 40 mg/dL   LDL Calculated 104 (H) 0 - 99 mg/dL   Chol/HDL Ratio 3.4 0.0 - 5.0 ratio  TSH  Result Value Ref Range   TSH 1.980 0.450 - 4.500 uIU/mL      Assessment & Plan:   1. Need for immunization against influenza - Flu Vaccine QUAD 36+ mos IM  2. Well adult exam - CBC with Differential/Platelet - CMP14+EGFR - Lipid panel - TSH  3. Chronic constipation - lactulose (CHRONULAC) 10 GM/15ML solution; TAKE 2 TABLESPOONFULS AT BEDTIME  Dispense: 480 mL; Refill: 6  4. Vitamin D deficienc - Vitamin D, Ergocalciferol, (DRISDOL) 1.25 MG (50000 UT) CAPS capsule; Take 1 capsule (50,000 Units total) by mouth every 7 (seven) days.  Dispense: 12 capsule;  Refill:  3   Continue all other maintenance medications as listed above.  Follow up plan: Return in about 1 year (around 10/19/2019) for recheck.  Educational handout given for [place] PA-C [facility] [address]  [place], [place] [address] [phone]   10/18/2018, 7:23 PM

## 2018-10-18 NOTE — Patient Instructions (Signed)

## 2018-10-19 LAB — CMP14+EGFR
ALT: 31 IU/L (ref 0–44)
AST: 23 IU/L (ref 0–40)
Albumin/Globulin Ratio: 1.9 (ref 1.2–2.2)
Albumin: 5.1 g/dL — ABNORMAL HIGH (ref 4.0–5.0)
Alkaline Phosphatase: 100 IU/L (ref 39–117)
BILIRUBIN TOTAL: 0.7 mg/dL (ref 0.0–1.2)
BUN/Creatinine Ratio: 28 — ABNORMAL HIGH (ref 9–20)
BUN: 22 mg/dL — AB (ref 6–20)
CHLORIDE: 97 mmol/L (ref 96–106)
CO2: 25 mmol/L (ref 20–29)
CREATININE: 0.79 mg/dL (ref 0.76–1.27)
Calcium: 10 mg/dL (ref 8.7–10.2)
GFR calc Af Amer: 137 mL/min/{1.73_m2} (ref >59)
GFR calc non Af Amer: 119 mL/min/{1.73_m2} (ref >59)
GLUCOSE: 79 mg/dL (ref 65–99)
Globulin, Total: 2.7 g/dL (ref 1.5–4.5)
Potassium: 4.6 mmol/L (ref 3.5–5.2)
SODIUM: 139 mmol/L (ref 134–144)
Total Protein: 7.8 g/dL (ref 6.0–8.5)

## 2018-10-19 LAB — CBC WITH DIFFERENTIAL/PLATELET
BASOS ABS: 0.1 10*3/uL (ref 0.0–0.2)
Basos: 1 %
EOS (ABSOLUTE): 0.1 10*3/uL (ref 0.0–0.4)
Eos: 2 %
Hematocrit: 39.6 % (ref 37.5–51.0)
Hemoglobin: 13.9 g/dL (ref 13.0–17.7)
Immature Grans (Abs): 0 10*3/uL (ref 0.0–0.1)
Immature Granulocytes: 0 %
Lymphocytes Absolute: 1.6 10*3/uL (ref 0.7–3.1)
Lymphs: 27 %
MCH: 30.8 pg (ref 26.6–33.0)
MCHC: 35.1 g/dL (ref 31.5–35.7)
MCV: 88 fL (ref 79–97)
MONOS ABS: 0.7 10*3/uL (ref 0.1–0.9)
Monocytes: 11 %
Neutrophils Absolute: 3.5 10*3/uL (ref 1.4–7.0)
Neutrophils: 59 %
Platelets: 302 10*3/uL (ref 150–450)
RBC: 4.51 x10E6/uL (ref 4.14–5.80)
RDW: 12.8 % (ref 11.6–15.4)
WBC: 6 10*3/uL (ref 3.4–10.8)

## 2018-10-19 LAB — LIPID PANEL
CHOLESTEROL TOTAL: 146 mg/dL (ref 100–199)
Chol/HDL Ratio: 3.2 ratio (ref 0.0–5.0)
HDL: 45 mg/dL (ref >39)
LDL CALC: 90 mg/dL (ref 0–99)
TRIGLYCERIDES: 53 mg/dL (ref 0–149)
VLDL CHOLESTEROL CAL: 11 mg/dL (ref 5–40)

## 2018-10-19 LAB — TSH: TSH: 1.07 u[IU]/mL (ref 0.450–4.500)

## 2019-02-02 DIAGNOSIS — H21233 Degeneration of iris (pigmentary), bilateral: Secondary | ICD-10-CM | POA: Diagnosis not present

## 2019-02-02 DIAGNOSIS — H401331 Pigmentary glaucoma, bilateral, mild stage: Secondary | ICD-10-CM | POA: Diagnosis not present

## 2019-03-05 DIAGNOSIS — G40219 Localization-related (focal) (partial) symptomatic epilepsy and epileptic syndromes with complex partial seizures, intractable, without status epilepticus: Secondary | ICD-10-CM | POA: Diagnosis not present

## 2019-03-05 DIAGNOSIS — E559 Vitamin D deficiency, unspecified: Secondary | ICD-10-CM | POA: Diagnosis not present

## 2019-03-05 DIAGNOSIS — K5904 Chronic idiopathic constipation: Secondary | ICD-10-CM | POA: Diagnosis not present

## 2019-03-05 DIAGNOSIS — G40409 Other generalized epilepsy and epileptic syndromes, not intractable, without status epilepticus: Secondary | ICD-10-CM | POA: Diagnosis not present

## 2019-07-17 DIAGNOSIS — H04123 Dry eye syndrome of bilateral lacrimal glands: Secondary | ICD-10-CM | POA: Diagnosis not present

## 2019-07-17 DIAGNOSIS — H5213 Myopia, bilateral: Secondary | ICD-10-CM | POA: Diagnosis not present

## 2019-07-17 DIAGNOSIS — H401331 Pigmentary glaucoma, bilateral, mild stage: Secondary | ICD-10-CM | POA: Diagnosis not present

## 2019-07-17 DIAGNOSIS — H21233 Degeneration of iris (pigmentary), bilateral: Secondary | ICD-10-CM | POA: Diagnosis not present

## 2019-07-17 LAB — HM DIABETES EYE EXAM

## 2019-08-21 DIAGNOSIS — Z23 Encounter for immunization: Secondary | ICD-10-CM | POA: Diagnosis not present

## 2019-10-22 ENCOUNTER — Other Ambulatory Visit: Payer: Self-pay

## 2019-10-23 ENCOUNTER — Ambulatory Visit (INDEPENDENT_AMBULATORY_CARE_PROVIDER_SITE_OTHER): Payer: Medicaid Other | Admitting: Physician Assistant

## 2019-10-23 ENCOUNTER — Encounter: Payer: Self-pay | Admitting: Physician Assistant

## 2019-10-23 VITALS — BP 112/69 | HR 72 | Temp 97.8°F | Ht 70.0 in | Wt 134.0 lb

## 2019-10-23 DIAGNOSIS — E559 Vitamin D deficiency, unspecified: Secondary | ICD-10-CM

## 2019-10-23 DIAGNOSIS — Z0000 Encounter for general adult medical examination without abnormal findings: Secondary | ICD-10-CM | POA: Diagnosis not present

## 2019-10-23 DIAGNOSIS — Z23 Encounter for immunization: Secondary | ICD-10-CM

## 2019-10-23 DIAGNOSIS — K5909 Other constipation: Secondary | ICD-10-CM | POA: Diagnosis not present

## 2019-10-23 MED ORDER — LACTULOSE 10 GM/15ML PO SOLN: ORAL | 6 refills | Status: DC

## 2019-10-23 MED ORDER — VITAMIN D (ERGOCALCIFEROL) 1.25 MG (50000 UNIT) PO CAPS: 50000.0000 [IU] | ORAL_CAPSULE | ORAL | 3 refills | Status: DC

## 2019-10-23 NOTE — Progress Notes (Signed)
    BP 112/69   Pulse 72   Temp 97.8 F (36.6 C)   Ht 5' 10" (1.778 m)   Wt 134 lb (60.8 kg)   SpO2 98%   BMI 19.23 kg/m    Subjective:    Patient ID: Colton Bruce, male    DOB: 01/27/1986, 34 y.o.   MRN: [id number]  Well exam today  HPI: Colton Bruce is a 34 y.o. male presenting on 10/23/2019 for Medical Management of Chronic Issues  The patient comes once a year to his neurologist then they have had very good controlled seizures over the past year.  They have had no breakthrough episodes.  He is tolerating his Briviact and Lamictal very well.  This patient comes in for annual well physical examination. All medications are reviewed today. There are no reports of any problems with the medications. All of the medical conditions are reviewed and updated.  Lab work is reviewed and will be ordered as medically necessary. There are no new problems reported with today's visit.  Patient reports doing well overall.  Past Medical History:  Diagnosis Date  . Mental retardation   . Seizures (HCC)    Relevant past medical, surgical, family and social history reviewed and updated as indicated. Interim medical history since our last visit reviewed. Allergies and medications reviewed and updated. DATA REVIEWED: CHART IN EPIC  Family History reviewed for pertinent findings.  Review of Systems  Constitutional: Negative.  Negative for appetite change and fatigue.  HENT: Negative.   Eyes: Negative.  Negative for pain and visual disturbance.  Respiratory: Negative.  Negative for cough, chest tightness, shortness of breath and wheezing.   Cardiovascular: Negative.  Negative for chest pain, palpitations and leg swelling.  Gastrointestinal: Negative.  Negative for abdominal pain, diarrhea, nausea and vomiting.  Endocrine: Negative.   Genitourinary: Negative.   Musculoskeletal: Negative.   Skin: Negative.  Negative for color change and rash.  Neurological: Negative.  Negative for  weakness, numbness and headaches.  Psychiatric/Behavioral: Negative.     Allergies as of 10/23/2019      Reactions   Penicillins Other (See Comments)   seizure      Medication List       Accurate as of October 23, 2019  2:39 PM. If you have any questions, ask your nurse or doctor.        Briviact 100 MG Tabs Generic drug: Brivaracetam Take 1 tablet by mouth 2 (two) times daily.   lactulose 10 GM/15ML solution Commonly known as: CHRONULAC TAKE 2 TABLESPOONFULS AT BEDTIME   LaMICtal XR 200 MG Tb24 24 hour tablet Generic drug: LamoTRIgine Take 400 mg by mouth daily.   Vitamin D (Ergocalciferol) 1.25 MG (50000 UNIT) Caps capsule Commonly known as: DRISDOL Take 1 capsule (50,000 Units total) by mouth every 7 (seven) days.          Objective:    BP 112/69   Pulse 72   Temp 97.8 F (36.6 C)   Ht 5' 10" (1.778 m)   Wt 134 lb (60.8 kg)   SpO2 98%   BMI 19.23 kg/m   Allergies  Allergen Reactions  . Penicillins Other (See Comments)    seizure    Wt Readings from Last 3 Encounters:  10/23/19 134 lb (60.8 kg)  10/18/18 127 lb (57.6 kg)  10/04/17 132 lb 3.2 oz (60 kg)    Physical Exam Constitutional:      Appearance: He is well-developed.  HENT:       Head: Atraumatic. Microcephalic.  Eyes:     Conjunctiva/sclera: Conjunctivae normal.     Pupils: Pupils are equal, round, and reactive to light.  Cardiovascular:     Rate and Rhythm: Normal rate and regular rhythm.     Heart sounds: Normal heart sounds.  Pulmonary:     Effort: Pulmonary effort is normal.     Breath sounds: Normal breath sounds.  Abdominal:     General: Bowel sounds are normal.     Palpations: Abdomen is soft.  Musculoskeletal:        General: Normal range of motion.     Cervical back: Normal range of motion and neck supple.  Skin:    General: Skin is warm and dry.  Psychiatric:        Attention and Perception: Attention normal.        Mood and Affect: Mood normal.        Speech: Speech  normal.        Cognition and Memory: Cognition normal.     Results for orders placed or performed in visit on 07/20/19  HM DIABETES EYE EXAM  Result Value Ref Range   HM Diabetic Eye Exam No Retinopathy No Retinopathy      Assessment & Plan:   1. Well adult exam - CBC with Differential/Platelet - CMP14+EGFR - Lipid panel - TSH  2. Chronic constipation - lactulose (CHRONULAC) 10 GM/15ML solution; TAKE 2 TABLESPOONFULS AT BEDTIME  Dispense: 480 mL; Refill: 6  3. Vitamin D deficiency - Vitamin D, Ergocalciferol, (DRISDOL) 1.25 MG (50000 UNIT) CAPS capsule; Take 1 capsule (50,000 Units total) by mouth every 7 (seven) days.  Dispense: 12 capsule; Refill: 3'   Continue all other maintenance medications as listed above.  Follow up plan: Return in about 1 year (around 10/22/2020) for well exam.  Educational handout given for health maintenance  Angel S. Jones PA-C [facility] [address]  [place], [place] [address] [phone]   10/23/2019, 2:39 PM  

## 2019-10-24 LAB — CBC WITH DIFFERENTIAL/PLATELET
Basophils Absolute: 0.1 10*3/uL (ref 0.0–0.2)
Basos: 1 %
EOS (ABSOLUTE): 0.1 10*3/uL (ref 0.0–0.4)
Eos: 1 %
Hematocrit: 45.8 % (ref 37.5–51.0)
Hemoglobin: 15.5 g/dL (ref 13.0–17.7)
Immature Grans (Abs): 0 10*3/uL (ref 0.0–0.1)
Immature Granulocytes: 0 %
Lymphocytes Absolute: 2 10*3/uL (ref 0.7–3.1)
Lymphs: 29 %
MCH: 30.2 pg (ref 26.6–33.0)
MCHC: 33.8 g/dL (ref 31.5–35.7)
MCV: 89 fL (ref 79–97)
Monocytes Absolute: 0.6 10*3/uL (ref 0.1–0.9)
Monocytes: 9 %
Neutrophils Absolute: 4.2 10*3/uL (ref 1.4–7.0)
Neutrophils: 60 %
Platelets: 313 10*3/uL (ref 150–450)
RBC: 5.13 x10E6/uL (ref 4.14–5.80)
RDW: 12.6 % (ref 11.6–15.4)
WBC: 7 10*3/uL (ref 3.4–10.8)

## 2019-10-24 LAB — CMP14+EGFR
ALT: 30 IU/L (ref 0–44)
AST: 21 IU/L (ref 0–40)
Albumin/Globulin Ratio: 1.8 (ref 1.2–2.2)
Albumin: 5.1 g/dL — ABNORMAL HIGH (ref 4.0–5.0)
Alkaline Phosphatase: 85 IU/L (ref 39–117)
BUN/Creatinine Ratio: 21 — ABNORMAL HIGH (ref 9–20)
BUN: 18 mg/dL (ref 6–20)
Bilirubin Total: 0.4 mg/dL (ref 0.0–1.2)
CO2: 26 mmol/L (ref 20–29)
Calcium: 9.9 mg/dL (ref 8.7–10.2)
Chloride: 98 mmol/L (ref 96–106)
Creatinine, Ser: 0.85 mg/dL (ref 0.76–1.27)
GFR calc Af Amer: 132 mL/min/{1.73_m2} (ref >59)
GFR calc non Af Amer: 114 mL/min/{1.73_m2} (ref >59)
Globulin, Total: 2.8 g/dL (ref 1.5–4.5)
Glucose: 90 mg/dL (ref 65–99)
Potassium: 4.8 mmol/L (ref 3.5–5.2)
Sodium: 139 mmol/L (ref 134–144)
Total Protein: 7.9 g/dL (ref 6.0–8.5)

## 2019-10-24 LAB — LIPID PANEL
Chol/HDL Ratio: 3.6 ratio (ref 0.0–5.0)
Cholesterol, Total: 168 mg/dL (ref 100–199)
HDL: 47 mg/dL (ref >39)
LDL Chol Calc (NIH): 110 mg/dL — ABNORMAL HIGH (ref 0–99)
Triglycerides: 57 mg/dL (ref 0–149)
VLDL Cholesterol Cal: 11 mg/dL (ref 5–40)

## 2019-10-24 LAB — TSH: TSH: 1.34 u[IU]/mL (ref 0.450–4.500)

## 2019-12-13 ENCOUNTER — Ambulatory Visit: Payer: Medicaid Other | Attending: Internal Medicine

## 2019-12-13 DIAGNOSIS — Z23 Encounter for immunization: Secondary | ICD-10-CM

## 2019-12-13 NOTE — Progress Notes (Signed)
   Covid-19 Vaccination Clinic  Name:  LOXLEY SCHMALE    MRN: [id number] DOB: 1986/03/20  12/13/2019  Mr. Popowski was observed post Covid-19 immunization for 15 minutes without incident. He was provided with Vaccine Information Sheet and instruction to access the V-Safe system.   Mr. Carattini was instructed to call 911 with any severe reactions post vaccine: Marland Kitchen Difficulty breathing  . Swelling of face and throat  . A fast heartbeat  . A bad rash all over body  . Dizziness and weakness   Immunizations Administered    Name Date Dose VIS Date Route   Moderna COVID-19 Vaccine 12/13/2019 10:19 AM 0.5 mL 08/21/2019 Intramuscular   Manufacturer: Moderna   Lot: [id number]   NDC: 91660-600-45

## 2020-01-10 ENCOUNTER — Ambulatory Visit: Payer: Medicaid Other | Attending: Internal Medicine

## 2020-01-10 DIAGNOSIS — Z23 Encounter for immunization: Secondary | ICD-10-CM

## 2020-01-10 NOTE — Progress Notes (Signed)
   Covid-19 Vaccination Clinic  Name:  KOICHI PLATTE    MRN: [id number] DOB: May 16, 1986  01/10/2020  Mr. Jolliff was observed post Covid-19 immunization for 15 minutes without incident. He was provided with Vaccine Information Sheet and instruction to access the V-Safe system.   Mr. Matters was instructed to call 911 with any severe reactions post vaccine: Marland Kitchen Difficulty breathing  . Swelling of face and throat  . A fast heartbeat  . A bad rash all over body  . Dizziness and weakness   Immunizations Administered    Name Date Dose VIS Date Route   Moderna COVID-19 Vaccine 01/10/2020  9:58 AM 0.5 mL 08/2019 Intramuscular   Manufacturer: Gala Murdoch   Lot: [id number]   NDC: 34035-248-18

## 2020-03-04 DIAGNOSIS — K5904 Chronic idiopathic constipation: Secondary | ICD-10-CM | POA: Diagnosis not present

## 2020-03-04 DIAGNOSIS — E559 Vitamin D deficiency, unspecified: Secondary | ICD-10-CM | POA: Diagnosis not present

## 2020-03-04 DIAGNOSIS — Z79899 Other long term (current) drug therapy: Secondary | ICD-10-CM | POA: Diagnosis not present

## 2020-03-04 DIAGNOSIS — G40219 Localization-related (focal) (partial) symptomatic epilepsy and epileptic syndromes with complex partial seizures, intractable, without status epilepticus: Secondary | ICD-10-CM | POA: Diagnosis not present

## 2020-03-04 DIAGNOSIS — G40909 Epilepsy, unspecified, not intractable, without status epilepticus: Secondary | ICD-10-CM | POA: Diagnosis not present

## 2020-03-04 DIAGNOSIS — G809 Cerebral palsy, unspecified: Secondary | ICD-10-CM | POA: Diagnosis not present

## 2020-08-05 ENCOUNTER — Other Ambulatory Visit: Payer: Self-pay

## 2020-08-05 ENCOUNTER — Ambulatory Visit: Payer: Medicaid Other

## 2020-10-23 ENCOUNTER — Encounter: Payer: Self-pay | Admitting: Family Medicine

## 2020-10-23 ENCOUNTER — Ambulatory Visit (INDEPENDENT_AMBULATORY_CARE_PROVIDER_SITE_OTHER): Payer: Medicaid Other | Admitting: Family Medicine

## 2020-10-23 ENCOUNTER — Other Ambulatory Visit: Payer: Self-pay

## 2020-10-23 VITALS — BP 111/74 | HR 81 | Temp 97.7°F | Ht 70.0 in | Wt 143.1 lb

## 2020-10-23 DIAGNOSIS — Z0000 Encounter for general adult medical examination without abnormal findings: Secondary | ICD-10-CM | POA: Diagnosis not present

## 2020-10-23 DIAGNOSIS — Z0001 Encounter for general adult medical examination with abnormal findings: Secondary | ICD-10-CM

## 2020-10-23 DIAGNOSIS — E559 Vitamin D deficiency, unspecified: Secondary | ICD-10-CM | POA: Diagnosis not present

## 2020-10-23 LAB — CMP14+EGFR
Albumin/Globulin Ratio: 2 (ref 1.2–2.2)
BUN: 16 mg/dL (ref 6–20)
GFR calc non Af Amer: 86 mL/min/{1.73_m2} (ref >59)

## 2020-10-23 LAB — CBC WITH DIFFERENTIAL/PLATELET
Basophils Absolute: 0.1 10*3/uL (ref 0.0–0.2)
Lymphs: 29 %
RDW: 12.6 % (ref 11.6–15.4)

## 2020-10-23 NOTE — Patient Instructions (Signed)
 Health Maintenance, Male Adopting a healthy lifestyle and getting preventive care are important in promoting health and wellness. Ask your health care provider about:  The right schedule for you to have regular tests and exams.  Things you can do on your own to prevent diseases and keep yourself healthy. What should I know about diet, weight, and exercise? Eat a healthy diet  Eat a diet that includes plenty of vegetables, fruits, low-fat dairy products, and lean protein.  Do not eat a lot of foods that are high in solid fats, added sugars, or sodium.   Maintain a healthy weight Body mass index (BMI) is a measurement that can be used to identify possible weight problems. It estimates body fat based on height and weight. Your health care provider can help determine your BMI and help you achieve or maintain a healthy weight. Get regular exercise Get regular exercise. This is one of the most important things you can do for your health. Most adults should:  Exercise for at least 150 minutes each week. The exercise should increase your heart rate and make you sweat (moderate-intensity exercise).  Do strengthening exercises at least twice a week. This is in addition to the moderate-intensity exercise.  Spend less time sitting. Even light physical activity can be beneficial. Watch cholesterol and blood lipids Have your blood tested for lipids and cholesterol at 34 years of age, then have this test every 5 years. You may need to have your cholesterol levels checked more often if:  Your lipid or cholesterol levels are high.  You are older than 34 years of age.  You are at high risk for heart disease. What should I know about cancer screening? Many types of cancers can be detected early and may often be prevented. Depending on your health history and family history, you may need to have cancer screening at various ages. This may include screening for:  Colorectal cancer.  Prostate  cancer.  Skin cancer.  Lung cancer. What should I know about heart disease, diabetes, and high blood pressure? Blood pressure and heart disease  High blood pressure causes heart disease and increases the risk of stroke. This is more likely to develop in people who have high blood pressure readings, are of African descent, or are overweight.  Talk with your health care provider about your target blood pressure readings.  Have your blood pressure checked: ? Every 3-5 years if you are 18-39 years of age. ? Every year if you are 40 years old or older.  If you are between the ages of 65 and 75 and are a current or former smoker, ask your health care provider if you should have a one-time screening for abdominal aortic aneurysm (AAA). Diabetes Have regular diabetes screenings. This checks your fasting blood sugar level. Have the screening done:  Once every three years after age 45 if you are at a normal weight and have a low risk for diabetes.  More often and at a younger age if you are overweight or have a high risk for diabetes. What should I know about preventing infection? Hepatitis B If you have a higher risk for hepatitis B, you should be screened for this virus. Talk with your health care provider to find out if you are at risk for hepatitis B infection. Hepatitis C Blood testing is recommended for:  Everyone born from 1945 through 1965.  Anyone with known risk factors for hepatitis C. Sexually transmitted infections (STIs)  You should be screened   each year for STIs, including gonorrhea and chlamydia, if: ? You are sexually active and are younger than 34 years of age. ? You are older than 34 years of age and your health care provider tells you that you are at risk for this type of infection. ? Your sexual activity has changed since you were last screened, and you are at increased risk for chlamydia or gonorrhea. Ask your health care provider if you are at risk.  Ask your  health care provider about whether you are at high risk for HIV. Your health care provider may recommend a prescription medicine to help prevent HIV infection. If you choose to take medicine to prevent HIV, you should first get tested for HIV. You should then be tested every 3 months for as long as you are taking the medicine. Follow these instructions at home: Lifestyle  Do not use any products that contain nicotine or tobacco, such as cigarettes, e-cigarettes, and chewing tobacco. If you need help quitting, ask your health care provider.  Do not use street drugs.  Do not share needles.  Ask your health care provider for help if you need support or information about quitting drugs. Alcohol use  Do not drink alcohol if your health care provider tells you not to drink.  If you drink alcohol: ? Limit how much you have to 0-2 drinks a day. ? Be aware of how much alcohol is in your drink. In the [place] one drink equals one 12 oz bottle of beer (355 mL), one 5 oz glass of wine (148 mL), or one 1 oz glass of hard liquor (44 mL). General instructions  Schedule regular health, dental, and eye exams.  Stay current with your vaccines.  Tell your health care provider if: ? You often feel depressed. ? You have ever been abused or do not feel safe at home. Summary  Adopting a healthy lifestyle and getting preventive care are important in promoting health and wellness.  Follow your health care provider's instructions about healthy diet, exercising, and getting tested or screened for diseases.  Follow your health care provider's instructions on monitoring your cholesterol and blood pressure. This information is not intended to replace advice given to you by your health care provider. Make sure you discuss any questions you have with your health care provider. Document Revised: 08/30/2018 Document Reviewed: 08/30/2018 Elsevier Patient Education  2021 Elsevier Inc.     Why follow it? Research  shows. . Those who follow the Mediterranean diet have a reduced risk of heart disease  . The diet is associated with a reduced incidence of Parkinson's and Alzheimer's diseases . People following the diet may have longer life expectancies and lower rates of chronic diseases  . The Dietary Guidelines for Americans recommends the Mediterranean diet as an eating plan to promote health and prevent disease  What Is the Mediterranean Diet?  . Healthy eating plan based on typical foods and recipes of Mediterranean-style cooking . The diet is primarily a plant based diet; these foods should make up a majority of meals   Starches - Plant based foods should make up a majority of meals - They are an important sources of vitamins, minerals, energy, antioxidants, and fiber - Choose whole grains, foods high in fiber and minimally processed items  - Typical grain sources include wheat, oats, barley, corn, brown rice, bulgar, farro, millet, polenta, couscous  - Various types of beans include chickpeas, lentils, fava beans, black beans, white beans   Fruits    Veggies - Large quantities of antioxidant rich fruits & veggies; 6 or more servings  - Vegetables can be eaten raw or lightly drizzled with oil and cooked  - Vegetables common to the traditional Mediterranean Diet include: artichokes, arugula, beets, broccoli, brussel sprouts, cabbage, carrots, celery, collard greens, cucumbers, eggplant, kale, leeks, lemons, lettuce, mushrooms, okra, onions, peas, peppers, potatoes, pumpkin, radishes, rutabaga, shallots, spinach, sweet potatoes, turnips, zucchini - Fruits common to the Mediterranean Diet include: apples, apricots, avocados, cherries, clementines, dates, figs, grapefruits, grapes, melons, nectarines, oranges, peaches, pears, pomegranates, strawberries, tangerines  Fats - Replace butter and margarine with healthy oils, such as olive oil, canola oil, and tahini  - Limit nuts to no more than a handful a day  -  Nuts include walnuts, almonds, pecans, pistachios, pine nuts  - Limit or avoid candied, honey roasted or heavily salted nuts - Olives are central to the Mediterranean diet - can be eaten whole or used in a variety of dishes   Meats Protein - Limiting red meat: no more than a few times a month - When eating red meat: choose lean cuts and keep the portion to the size of deck of cards - Eggs: approx. 0 to 4 times a week  - Fish and lean poultry: at least 2 a week  - Healthy protein sources include, chicken, turkey, lean beef, lamb - Increase intake of seafood such as tuna, salmon, trout, mackerel, shrimp, scallops - Avoid or limit high fat processed meats such as sausage and bacon  Dairy - Include moderate amounts of low fat dairy products  - Focus on healthy dairy such as fat free yogurt, skim milk, low or reduced fat cheese - Limit dairy products higher in fat such as whole or 2% milk, cheese, ice cream  Alcohol - Moderate amounts of red wine is ok  - No more than 5 oz daily for women (all ages) and men older than age 65  - No more than 10 oz of wine daily for men younger than 65  Other - Limit sweets and other desserts  - Use herbs and spices instead of salt to flavor foods  - Herbs and spices common to the traditional Mediterranean Diet include: basil, bay leaves, chives, cloves, cumin, fennel, garlic, lavender, marjoram, mint, oregano, parsley, pepper, rosemary, sage, savory, sumac, tarragon, thyme   It's not just a diet, it's a lifestyle:  . The Mediterranean diet includes lifestyle factors typical of those in the region  . Foods, drinks and meals are best eaten with others and savored . Daily physical activity is important for overall good health . This could be strenuous exercise like running and aerobics . This could also be more leisurely activities such as walking, housework, yard-work, or taking the stairs . Moderation is the key; a balanced and healthy diet accommodates most foods  and drinks . Consider portion sizes and frequency of consumption of certain foods   Meal Ideas & Options:  . Breakfast:  o Whole wheat toast or whole wheat English muffins with peanut butter & hard boiled egg o Steel cut oats topped with apples & cinnamon and skim milk  o Fresh fruit: banana, strawberries, melon, berries, peaches  o Smoothies: strawberries, bananas, greek yogurt, peanut butter o Low fat greek yogurt with blueberries and granola  o Egg white omelet with spinach and mushrooms o Breakfast couscous: whole wheat couscous, apricots, skim milk, cranberries  . Sandwiches:  o Hummus and grilled vegetables (peppers, zucchini, squash) on whole wheat   bread   o Grilled chicken on whole wheat pita with lettuce, tomatoes, cucumbers or tzatziki  o Tuna salad on whole wheat bread: tuna salad made with greek yogurt, olives, red peppers, capers, green onions o Garlic rosemary lamb pita: lamb sauted with garlic, rosemary, salt & pepper; add lettuce, cucumber, greek yogurt to pita - flavor with lemon juice and black pepper  . Seafood:  o Mediterranean grilled salmon, seasoned with garlic, basil, parsley, lemon juice and black pepper o Shrimp, lemon, and spinach whole-grain pasta salad made with low fat greek yogurt  o Seared scallops with lemon orzo  o Seared tuna steaks seasoned salt, pepper, coriander topped with tomato mixture of olives, tomatoes, olive oil, minced garlic, parsley, green onions and cappers  . Meats:  o Herbed greek chicken salad with kalamata olives, cucumber, feta  o Red bell peppers stuffed with spinach, bulgur, lean ground beef (or lentils) & topped with feta   o Kebabs: skewers of chicken, tomatoes, onions, zucchini, squash  o Turkey burgers: made with red onions, mint, dill, lemon juice, feta cheese topped with roasted red peppers . Vegetarian o Cucumber salad: cucumbers, artichoke hearts, celery, red onion, feta cheese, tossed in olive oil & lemon juice  o Hummus  and whole grain pita points with a greek salad (lettuce, tomato, feta, olives, cucumbers, red onion) o Lentil soup with celery, carrots made with vegetable broth, garlic, salt and pepper  o Tabouli salad: parsley, bulgur, mint, scallions, cucumbers, tomato, radishes, lemon juice, olive oil, salt and pepper.      American Heart Association (AHA) Exercise Recommendation  Being physically active is important to prevent heart disease and stroke, the nation's No. 1and No. 5killers. To improve overall cardiovascular health, we suggest at least 150 minutes per week of moderate exercise or 75 minutes per week of vigorous exercise (or a combination of moderate and vigorous activity). Thirty minutes a day, five times a week is an easy goal to remember. You will also experience benefits even if you divide your time into two or three segments of 10 to 15 minutes per day.  For people who would benefit from lowering their blood pressure or cholesterol, we recommend 40 minutes of aerobic exercise of moderate to vigorous intensity three to four times a week to lower the risk for heart attack and stroke.  Physical activity is anything that makes you move your body and burn calories.  This includes things like climbing stairs or playing sports. Aerobic exercises benefit your heart, and include walking, jogging, swimming or biking. Strength and stretching exercises are best for overall stamina and flexibility.  The simplest, positive change you can make to effectively improve your heart health is to start walking. It's enjoyable, free, easy, social and great exercise. A walking program is flexible and boasts high success rates because people can stick with it. It's easy for walking to become a regular and satisfying part of life.   For Overall Cardiovascular Health:  At least 30 minutes of moderate-intensity aerobic activity at least 5 days per week for a total of 150  OR   At least 25 minutes of vigorous  aerobic activity at least 3 days per week for a total of 75 minutes; or a combination of moderate- and vigorous-intensity aerobic activity  AND   Moderate- to high-intensity muscle-strengthening activity at least 2 days per week for additional health benefits.  For Lowering Blood Pressure and Cholesterol  An average 40 minutes of moderate- to vigorous-intensity aerobic activity 3   or 4 times per week  What if I can't make it to the time goal? Something is always better than nothing! And everyone has to start somewhere. Even if you've been sedentary for years, today is the day you can begin to make healthy changes in your life. If you don't think you'll make it for 30 or 40 minutes, set a reachable goal for today. You can work up toward your overall goal by increasing your time as you get stronger. Don't let all-or-nothing thinking rob you of doing what you can every day.  Source:http://www.heart.org    

## 2020-10-23 NOTE — Progress Notes (Signed)
Colton Bruce is a 35 y.o. male presents to office today for annual physical exam examination.  He is here today with his grandmother.   Concerns today include: 1. No concerns  Occupation: works with wood, Marital status: single, Substance use: denies Diet: good, eats fruits and vegetables, Exercise: walks daily Last eye exam: once a year Last dental exam: not recently Refills needed today: none  He is followed by neurology for his seizure disorder, last appointment 6 months ago. He has not had any seizures recently.   Past Medical History:  Diagnosis Date  . Mental retardation   . Seizures ([place])    Social History   Socioeconomic History  . Marital status: Single    Spouse name: Not on file  . Number of children: Not on file  . Years of education: Not on file  . Highest education level: Not on file  Occupational History  . Not on file  Tobacco Use  . Smoking status: Never Smoker  . Smokeless tobacco: Never Used  Substance and Sexual Activity  . Alcohol use: No  . Drug use: No  . Sexual activity: Not on file  Other Topics Concern  . Not on file  Social History Narrative  . Not on file   Social Determinants of Health   Financial Resource Strain: Not on file  Food Insecurity: Not on file  Transportation Needs: Not on file  Physical Activity: Not on file  Stress: Not on file  Social Connections: Not on file  Intimate Partner Violence: Not on file   Past Surgical History:  Procedure Laterality Date  . FINGER SURGERY     Family History  Problem Relation Age of Onset  . COPD Paternal Grandmother   . Heart disease Paternal Grandfather   . Hyperlipidemia Paternal Grandfather     Current Outpatient Medications:  .  Brivaracetam 100 MG TABS, Take 1 tablet by mouth 2 (two) times daily., Disp: , Rfl:  .  lactulose (CHRONULAC) 10 GM/15ML solution, TAKE 2 TABLESPOONFULS AT BEDTIME, Disp: 480 mL, Rfl: 6 .  LamoTRIgine 200 MG TB24 24 hour tablet, Take 400 mg by  mouth daily., Disp: , Rfl:  .  Vitamin D, Ergocalciferol, (DRISDOL) 1.25 MG (50000 UNIT) CAPS capsule, Take 1 capsule (50,000 Units total) by mouth every 7 (seven) days., Disp: 12 capsule, Rfl: 3  Allergies  Allergen Reactions  . Penicillins Other (See Comments)    seizure     ROS: Review of Systems Pertinent items noted in HPI and remainder of comprehensive ROS otherwise negative.    Physical exam BP 111/74   Pulse 81   Temp 97.7 F (36.5 C) (Temporal)   Ht 5' 10"  (1.778 m)   Wt 143 lb 2 oz (64.9 kg)   BMI 20.54 kg/m  General appearance: alert, cooperative and no distress Head: Normocephalic, without obvious abnormality, atraumatic Eyes: conjunctivae/corneas clear. PERRL, EOM's intact. Fundi benign. Ears: normal TM's and external ear canals both ears Nose: Nares normal. Septum midline. Mucosa normal. No drainage or sinus tenderness. Throat: lips, mucosa, and tongue normal; teeth and gums normal Neck: no adenopathy, no carotid bruit, no JVD, supple, symmetrical, trachea midline and thyroid not enlarged, symmetric, no tenderness/mass/nodules Lungs: clear to auscultation bilaterally Heart: regular rate and rhythm, S1, S2 normal, no murmur, click, rub or gallop Abdomen: soft, non-tender; bowel sounds normal; no masses,  no organomegaly Extremities: extremities normal, atraumatic, no cyanosis or edema Pulses: 2+ and symmetric Skin: Skin color, texture, turgor normal. No rashes  or lesions Lymph nodes: Cervical, supraclavicular, and axillary nodes normal. Neurologic: Alert and oriented X 3, normal strength and tone. Normal symmetric reflexes. Normal coordination and gait    Assessment/ Plan: Colton Bruce here for annual physical exam.   Diagnoses and all orders for this visit:  Routine general medical examination at a health care facility Labs pending as below. He last ate about 3 hours prior to labs.  -     CBC with Differential/Platelet -     CMP14+EGFR -     Lipid  panel -     TSH -     VITAMIN D 25 Hydroxy (Vit-D Deficiency, Fractures)  Vitamin D deficiency On repletion therapy. Labs pending.  -     VITAMIN D 25 Hydroxy (Vit-D Deficiency, Fractures)   Counseled on healthy lifestyle choices, including diet (rich in fruits, vegetables and lean meats and low in salt and simple carbohydrates) and exercise (at least 30 minutes of moderate physical activity daily).  Patient to follow up in 1 year for annual exam or sooner if needed.  The above assessment and management plan was discussed with the patient. The patient verbalized understanding of and has agreed to the management plan. Patient is aware to call the clinic if symptoms persist or worsen. Patient is aware when to return to the clinic for a follow-up visit. Patient educated on when it is appropriate to go to the emergency department.   Marjorie Smolder, FNP-C [facility] [address] [place], [place] [address] [phone]

## 2020-10-24 LAB — CBC WITH DIFFERENTIAL/PLATELET
Basos: 1 %
EOS (ABSOLUTE): 0.1 10*3/uL (ref 0.0–0.4)
Eos: 2 %
Hematocrit: 47.3 % (ref 37.5–51.0)
Hemoglobin: 16.2 g/dL (ref 13.0–17.7)
Immature Grans (Abs): 0 10*3/uL (ref 0.0–0.1)
Immature Granulocytes: 0 %
Lymphocytes Absolute: 2 10*3/uL (ref 0.7–3.1)
MCH: 30.8 pg (ref 26.6–33.0)
MCHC: 34.2 g/dL (ref 31.5–35.7)
MCV: 90 fL (ref 79–97)
Monocytes Absolute: 0.7 10*3/uL (ref 0.1–0.9)
Monocytes: 10 %
Neutrophils Absolute: 4.2 10*3/uL (ref 1.4–7.0)
Neutrophils: 58 %
Platelets: 317 10*3/uL (ref 150–450)
RBC: 5.26 x10E6/uL (ref 4.14–5.80)
WBC: 7.1 10*3/uL (ref 3.4–10.8)

## 2020-10-24 LAB — CMP14+EGFR
ALT: 23 IU/L (ref 0–44)
AST: 23 IU/L (ref 0–40)
Albumin: 5.5 g/dL — ABNORMAL HIGH (ref 4.0–5.0)
Alkaline Phosphatase: 104 IU/L (ref 44–121)
BUN/Creatinine Ratio: 14 (ref 9–20)
Bilirubin Total: 0.6 mg/dL (ref 0.0–1.2)
CO2: 26 mmol/L (ref 20–29)
Calcium: 10.5 mg/dL — ABNORMAL HIGH (ref 8.7–10.2)
Chloride: 96 mmol/L (ref 96–106)
Creatinine, Ser: 1.11 mg/dL (ref 0.76–1.27)
GFR calc Af Amer: 100 mL/min/{1.73_m2} (ref >59)
Globulin, Total: 2.7 g/dL (ref 1.5–4.5)
Glucose: 89 mg/dL (ref 65–99)
Potassium: 5 mmol/L (ref 3.5–5.2)
Sodium: 138 mmol/L (ref 134–144)
Total Protein: 8.2 g/dL (ref 6.0–8.5)

## 2020-10-24 LAB — TSH: TSH: 1.57 u[IU]/mL (ref 0.450–4.500)

## 2020-10-24 LAB — LIPID PANEL
Chol/HDL Ratio: 4 ratio (ref 0.0–5.0)
Cholesterol, Total: 182 mg/dL (ref 100–199)
HDL: 46 mg/dL (ref >39)
LDL Chol Calc (NIH): 116 mg/dL — ABNORMAL HIGH (ref 0–99)
Triglycerides: 112 mg/dL (ref 0–149)
VLDL Cholesterol Cal: 20 mg/dL (ref 5–40)

## 2020-10-24 LAB — VITAMIN D 25 HYDROXY (VIT D DEFICIENCY, FRACTURES): Vit D, 25-Hydroxy: 84.9 ng/mL (ref 30.0–100.0)

## 2020-11-26 ENCOUNTER — Ambulatory Visit (INDEPENDENT_AMBULATORY_CARE_PROVIDER_SITE_OTHER): Payer: Medicaid Other

## 2020-11-26 ENCOUNTER — Other Ambulatory Visit: Payer: Self-pay

## 2020-11-26 DIAGNOSIS — Z23 Encounter for immunization: Secondary | ICD-10-CM | POA: Diagnosis not present

## 2020-11-26 NOTE — Progress Notes (Signed)
   Covid-19 Vaccination Clinic  Name:  [name]    MRN: [id number] DOB: Feb 02, 1986  11/26/2020  [name] was observed post Covid-19 immunization for 15 minutes without incident. He was provided with Vaccine Information Sheet and instruction to access the V-Safe system.   [name] was instructed to call 911 with any severe reactions post vaccine: Marland Kitchen Difficulty breathing  . Swelling of face and throat  . A fast heartbeat  . A bad rash all over body  . Dizziness and weakness   Immunizations Administered    Name Date Dose VIS Date Route   Moderna Covid-19 Booster Vaccine 11/26/2020  9:21 AM 0.25 mL 07/09/2020 Intramuscular   Manufacturer: Moderna   Lot: [id number]   NDC: 77414-239-53

## 2021-03-20 DIAGNOSIS — K5904 Chronic idiopathic constipation: Secondary | ICD-10-CM | POA: Diagnosis not present

## 2021-03-20 DIAGNOSIS — E559 Vitamin D deficiency, unspecified: Secondary | ICD-10-CM | POA: Diagnosis not present

## 2021-03-20 DIAGNOSIS — G40909 Epilepsy, unspecified, not intractable, without status epilepticus: Secondary | ICD-10-CM | POA: Diagnosis not present

## 2021-03-20 DIAGNOSIS — G40219 Localization-related (focal) (partial) symptomatic epilepsy and epileptic syndromes with complex partial seizures, intractable, without status epilepticus: Secondary | ICD-10-CM | POA: Diagnosis not present

## 2021-03-20 DIAGNOSIS — Z79899 Other long term (current) drug therapy: Secondary | ICD-10-CM | POA: Diagnosis not present

## 2021-03-20 DIAGNOSIS — G809 Cerebral palsy, unspecified: Secondary | ICD-10-CM | POA: Diagnosis not present

## 2021-07-23 ENCOUNTER — Encounter: Payer: Self-pay | Admitting: *Deleted

## 2021-08-04 ENCOUNTER — Ambulatory Visit (INDEPENDENT_AMBULATORY_CARE_PROVIDER_SITE_OTHER): Payer: Medicaid Other

## 2021-08-04 ENCOUNTER — Other Ambulatory Visit: Payer: Self-pay

## 2021-08-04 DIAGNOSIS — Z23 Encounter for immunization: Secondary | ICD-10-CM

## 2021-09-22 DIAGNOSIS — G809 Cerebral palsy, unspecified: Secondary | ICD-10-CM | POA: Diagnosis not present

## 2021-09-22 DIAGNOSIS — G40219 Localization-related (focal) (partial) symptomatic epilepsy and epileptic syndromes with complex partial seizures, intractable, without status epilepticus: Secondary | ICD-10-CM | POA: Diagnosis not present

## 2021-09-22 DIAGNOSIS — K5904 Chronic idiopathic constipation: Secondary | ICD-10-CM | POA: Diagnosis not present

## 2021-09-22 DIAGNOSIS — Z79899 Other long term (current) drug therapy: Secondary | ICD-10-CM | POA: Diagnosis not present

## 2021-09-22 DIAGNOSIS — E559 Vitamin D deficiency, unspecified: Secondary | ICD-10-CM | POA: Diagnosis not present

## 2021-09-22 DIAGNOSIS — G40909 Epilepsy, unspecified, not intractable, without status epilepticus: Secondary | ICD-10-CM | POA: Diagnosis not present

## 2021-10-26 ENCOUNTER — Encounter: Payer: Self-pay | Admitting: Family

## 2021-10-26 ENCOUNTER — Ambulatory Visit: Payer: Medicaid Other | Admitting: Family

## 2021-10-26 ENCOUNTER — Encounter: Payer: Medicaid Other | Admitting: Family Medicine

## 2021-10-26 VITALS — BP 119/76 | HR 86 | Temp 98.0°F | Ht 70.0 in | Wt 144.2 lb

## 2021-10-26 DIAGNOSIS — Z0000 Encounter for general adult medical examination without abnormal findings: Secondary | ICD-10-CM

## 2021-10-26 DIAGNOSIS — K5909 Other constipation: Secondary | ICD-10-CM | POA: Diagnosis not present

## 2021-10-26 DIAGNOSIS — G40909 Epilepsy, unspecified, not intractable, without status epilepticus: Secondary | ICD-10-CM | POA: Diagnosis not present

## 2021-10-26 DIAGNOSIS — Z0001 Encounter for general adult medical examination with abnormal findings: Secondary | ICD-10-CM | POA: Diagnosis not present

## 2021-10-26 DIAGNOSIS — Z114 Encounter for screening for human immunodeficiency virus [HIV]: Secondary | ICD-10-CM

## 2021-10-26 DIAGNOSIS — E559 Vitamin D deficiency, unspecified: Secondary | ICD-10-CM

## 2021-10-26 DIAGNOSIS — Z1159 Encounter for screening for other viral diseases: Secondary | ICD-10-CM

## 2021-10-26 NOTE — Patient Instructions (Signed)

## 2021-10-26 NOTE — Progress Notes (Signed)
Subjective:    Patient ID: Colton Bruce, male    DOB: 10-05-1985, 36 y.o.   MRN: [id number]  No chief complaint on file.  PT presents to the office today for CPE. He is followed by Neurologists every 6 months for seizures. He states it has been years since his last seizure.  Constipation This is a chronic problem. The current episode started more than 1 year ago. The problem has been resolved since onset. His stool frequency is 1 time per day. He has tried laxatives for the symptoms. The treatment provided moderate relief.     Review of Systems  Gastrointestinal:  Positive for constipation.  All other systems reviewed and are negative.  Family History  Problem Relation Age of Onset   COPD Paternal Grandmother    Heart disease Paternal Grandfather    Hyperlipidemia Paternal Grandfather    Social History   Socioeconomic History   Marital status: Single    Spouse name: Not on file   Number of children: Not on file   Years of education: Not on file   Highest education level: Not on file  Occupational History   Not on file  Tobacco Use   Smoking status: Never   Smokeless tobacco: Never  Substance and Sexual Activity   Alcohol use: No   Drug use: No   Sexual activity: Not on file  Other Topics Concern   Not on file  Social History Narrative   Not on file   Social Determinants of Health   Financial Resource Strain: Not on file  Food Insecurity: Not on file  Transportation Needs: Not on file  Physical Activity: Not on file  Stress: Not on file  Social Connections: Not on file       Objective:   Physical Exam Vitals reviewed.  Constitutional:      General: He is not in acute distress.    Appearance: He is well-developed.  HENT:     Head: Normocephalic.     Right Ear: Tympanic membrane normal.     Left Ear: Tympanic membrane normal.  Eyes:     General:        Right eye: No discharge.        Left eye: No discharge.     Pupils: Pupils are equal, round,  and reactive to light.  Neck:     Thyroid: No thyromegaly.  Cardiovascular:     Rate and Rhythm: Normal rate and regular rhythm.     Heart sounds: Normal heart sounds. No murmur heard. Pulmonary:     Effort: Pulmonary effort is normal. No respiratory distress.     Breath sounds: Normal breath sounds. No wheezing.  Abdominal:     General: Bowel sounds are normal. There is no distension.     Palpations: Abdomen is soft.     Tenderness: There is no abdominal tenderness.  Musculoskeletal:        General: No tenderness. Normal range of motion.     Cervical back: Normal range of motion and neck supple.  Skin:    General: Skin is warm and dry.     Findings: No erythema or rash.  Neurological:     Mental Status: He is alert and oriented to person, place, and time.     Cranial Nerves: No cranial nerve deficit.     Deep Tendon Reflexes: Reflexes are normal and symmetric.  Psychiatric:        Behavior: Behavior normal.  Thought Content: Thought content normal.        Judgment: Judgment normal.         BP 119/76    Pulse 86    Temp 98 F (36.7 C) (Oral)    Ht 5' 10"  (1.778 m)    Wt 144 lb 3.2 oz (65.4 kg)    SpO2 98%    BMI 20.69 kg/m   Assessment & Plan:  Colton Bruce comes in today with chief complaint of Annual Exam   Diagnosis and orders addressed:  1. Annual physical exam - CMP14+EGFR - CBC with Differential/Platelet - Lipid panel - TSH - HIV Antibody (routine testing w rflx) - Hepatitis C antibody  2. Vitamin D deficiency - CMP14+EGFR - CBC with Differential/Platelet  3. Seizure disorder (Colton Bruce) - CMP14+EGFR - CBC with Differential/Platelet  4. Chronic constipation - CMP14+EGFR - CBC with Differential/Platelet  5. Need for hepatitis C screening test - CMP14+EGFR - CBC with Differential/Platelet - Hepatitis C antibody  6. Encounter for screening for HIV - CMP14+EGFR - CBC with Differential/Platelet - HIV Antibody (routine testing w  rflx)   Labs pending Health Maintenance reviewed Diet and exercise encouraged  Follow up plan: 1 year  Colton Dun, FNP

## 2021-10-27 LAB — CMP14+EGFR
ALT: 29 IU/L (ref 0–44)
AST: 22 IU/L (ref 0–40)
Albumin/Globulin Ratio: 1.9 (ref 1.2–2.2)
Albumin: 5.2 g/dL — ABNORMAL HIGH (ref 4.0–5.0)
Alkaline Phosphatase: 117 IU/L (ref 44–121)
BUN/Creatinine Ratio: 13 (ref 9–20)
BUN: 14 mg/dL (ref 6–20)
Bilirubin Total: 0.5 mg/dL (ref 0.0–1.2)
CO2: 26 mmol/L (ref 20–29)
Calcium: 10.5 mg/dL — ABNORMAL HIGH (ref 8.7–10.2)
Chloride: 97 mmol/L (ref 96–106)
Creatinine, Ser: 1.1 mg/dL (ref 0.76–1.27)
Globulin, Total: 2.8 g/dL (ref 1.5–4.5)
Glucose: 87 mg/dL (ref 70–99)
Potassium: 4.7 mmol/L (ref 3.5–5.2)
Sodium: 138 mmol/L (ref 134–144)
Total Protein: 8 g/dL (ref 6.0–8.5)
eGFR: 90 mL/min/{1.73_m2} (ref >59)

## 2021-10-27 LAB — LIPID PANEL
Chol/HDL Ratio: 4.1 ratio (ref 0.0–5.0)
Cholesterol, Total: 175 mg/dL (ref 100–199)
HDL: 43 mg/dL (ref >39)
LDL Chol Calc (NIH): 113 mg/dL — ABNORMAL HIGH (ref 0–99)
Triglycerides: 106 mg/dL (ref 0–149)
VLDL Cholesterol Cal: 19 mg/dL (ref 5–40)

## 2021-10-27 LAB — CBC WITH DIFFERENTIAL/PLATELET
Basophils Absolute: 0.1 10*3/uL (ref 0.0–0.2)
Basos: 1 %
EOS (ABSOLUTE): 0.1 10*3/uL (ref 0.0–0.4)
Eos: 2 %
Hematocrit: 46.1 % (ref 37.5–51.0)
Hemoglobin: 16 g/dL (ref 13.0–17.7)
Immature Grans (Abs): 0 10*3/uL (ref 0.0–0.1)
Immature Granulocytes: 0 %
Lymphocytes Absolute: 1.6 10*3/uL (ref 0.7–3.1)
Lymphs: 24 %
MCH: 30 pg (ref 26.6–33.0)
MCHC: 34.7 g/dL (ref 31.5–35.7)
MCV: 86 fL (ref 79–97)
Monocytes Absolute: 0.7 10*3/uL (ref 0.1–0.9)
Monocytes: 10 %
Neutrophils Absolute: 4.3 10*3/uL (ref 1.4–7.0)
Neutrophils: 63 %
Platelets: 337 10*3/uL (ref 150–450)
RBC: 5.34 x10E6/uL (ref 4.14–5.80)
RDW: 12.4 % (ref 11.6–15.4)
WBC: 6.8 10*3/uL (ref 3.4–10.8)

## 2021-10-27 LAB — HIV ANTIBODY (ROUTINE TESTING W REFLEX): HIV Screen 4th Generation wRfx: NONREACTIVE

## 2021-10-27 LAB — TSH: TSH: 1.89 u[IU]/mL (ref 0.450–4.500)

## 2021-10-27 LAB — HEPATITIS C ANTIBODY: Hep C Virus Ab: <0.1 s/co ratio (ref 0.0–0.9)

## 2021-12-12 ENCOUNTER — Encounter (HOSPITAL_COMMUNITY): Payer: Self-pay

## 2021-12-12 ENCOUNTER — Other Ambulatory Visit: Payer: Self-pay

## 2021-12-12 ENCOUNTER — Emergency Department (HOSPITAL_COMMUNITY)
Admission: EM | Admit: 2021-12-12 | Discharge: 2021-12-12 | Disposition: A | Discharge status: Home / Self Care | Payer: Medicaid Other | Attending: Emergency Medicine | Admitting: Emergency Medicine

## 2021-12-12 DIAGNOSIS — N3001 Acute cystitis with hematuria: Secondary | ICD-10-CM | POA: Diagnosis not present

## 2021-12-12 DIAGNOSIS — R319 Hematuria, unspecified: Secondary | ICD-10-CM | POA: Diagnosis present

## 2021-12-12 LAB — URINALYSIS, ROUTINE W REFLEX MICROSCOPIC
Bacteria, UA: NONE SEEN
Bilirubin Urine: NEGATIVE
Glucose, UA: NEGATIVE mg/dL
Ketones, ur: NEGATIVE mg/dL
Nitrite: NEGATIVE
Protein, ur: 30 mg/dL — AB
Specific Gravity, Urine: 1.004 — ABNORMAL LOW (ref 1.005–1.030)
WBC, UA: >50 WBC/hpf — ABNORMAL HIGH (ref 0–5)
pH: 7 (ref 5.0–8.0)

## 2021-12-12 MED ORDER — CIPROFLOXACIN HCL 500 MG PO TABS: 500.0000 mg | ORAL_TABLET | Freq: Two times a day (BID) | ORAL | 0 refills | Status: DC

## 2021-12-12 MED ORDER — CIPROFLOXACIN HCL 250 MG PO TABS
500.0000 mg | ORAL_TABLET | Freq: Once | ORAL | Status: AC
  Administered 2021-12-12: 500 mg via ORAL
  Filled 2021-12-12: qty 2

## 2021-12-12 NOTE — ED Provider Notes (Signed)
? EMERGENCY DEPARTMENT ?Provider Note ? ? ?CSN: [id number] ?Arrival date & time: 12/12/21  1853 ? ?  ? ?History ? ?Chief Complaint  ?Patient presents with  ? Hematuria  ? ? ?Laquinton A Reifsteck is a 36 y.o. male. ? ? ?Hematuria ? ?Patient is a 36 year old male with mental delay presenting today with hematuria.  Patient's grandfather is at bedside and provides primary history.  Patient states he is having dysuria starting today, also noticed hematuria in his urine.  No penile discharge, not sexually active.  Denies any abdominal pain, fevers, vomiting or change in bowel habits. ?  ? ?Home Medications ?Prior to Admission medications   ?Medication Sig Start Date End Date Taking? Authorizing Provider  ?ciprofloxacin (CIPRO) 500 MG tablet Take 1 tablet (500 mg total) by mouth every 12 (twelve) hours. 12/12/21  Yes Theron Arista, PA-C  ?Brivaracetam 100 MG TABS Take 1 tablet by mouth 2 (two) times daily.    [provider]  ?lactulose (CHRONULAC) 10 GM/15ML solution TAKE 2 TABLESPOONFULS AT BEDTIME 10/23/19   Remus Loffler, PA-C  ?LamoTRIgine 200 MG TB24 24 hour tablet Take 400 mg by mouth daily.    [provider]  ?Vitamin D, Ergocalciferol, (DRISDOL) 1.25 MG (50000 UNIT) CAPS capsule Take 1 capsule (50,000 Units total) by mouth every 7 (seven) days. 10/23/19   Remus Loffler, PA-C  ?   ? ?Allergies    ?Penicillins   ? ?Review of Systems   ?Review of Systems  ?Genitourinary:  Positive for hematuria.  ? ?Physical Exam ?Updated Vital Signs ?BP 123/83 (BP Location: Right Arm)   Pulse 91   Temp (!) 97.5 ?F (36.4 ?C) (Oral)   Resp 18   Ht 5\' 10"  (1.778 m)   Wt 63.5 kg   SpO2 100%   BMI 20.09 kg/m?  ?Physical Exam ?Vitals and nursing note reviewed. Exam conducted with a chaperone present.  ?Constitutional:   ?   Appearance: Normal appearance.  ?HENT:  ?   Head: Normocephalic and atraumatic.  ?Eyes:  ?   General: No scleral icterus.    ?   Right eye: No discharge.     ?   Left eye: No discharge.  ?    Extraocular Movements: Extraocular movements intact.  ?   Pupils: Pupils are equal, round, and reactive to light.  ?Cardiovascular:  ?   Rate and Rhythm: Normal rate and regular rhythm.  ?   Pulses: Normal pulses.  ?   Heart sounds: Normal heart sounds. No murmur heard. ?  No friction rub. No gallop.  ?Pulmonary:  ?   Effort: Pulmonary effort is normal. No respiratory distress.  ?   Breath sounds: Normal breath sounds.  ?Abdominal:  ?   General: Abdomen is flat. Bowel sounds are normal. There is no distension.  ?   Palpations: Abdomen is soft.  ?   Tenderness: There is no abdominal tenderness.  ?Skin: ?   General: Skin is warm and dry.  ?   Coloration: Skin is not jaundiced.  ?Neurological:  ?   Mental Status: He is alert. Mental status is at baseline.  ?   Coordination: Coordination normal.  ? ? ?ED Results / Procedures / Treatments   ?Labs ?(all labs ordered are listed, but only abnormal results are displayed) ?Labs Reviewed  ?URINALYSIS, ROUTINE W REFLEX MICROSCOPIC - Abnormal; Notable for the following components:  ?    Result Value  ? Color, Urine STRAW (*)   ? APPearance HAZY (*)   ?  Specific Gravity, Urine 1.004 (*)   ? Hgb urine dipstick LARGE (*)   ? Protein, ur 30 (*)   ? Leukocytes,Ua LARGE (*)   ? WBC, UA >50 (*)   ? All other components within normal limits  ?URINE CULTURE  ?GC/CHLAMYDIA PROBE AMP ([place]) NOT AT [facility]  ? ? ?EKG ?None ? ?Radiology ?No results found. ? ?Procedures ?Procedures  ? ? ?Medications Ordered in ED ?Medications  ?ciprofloxacin (CIPRO) tablet 500 mg (has no administration in time range)  ? ? ?ED Course/ Medical Decision Making/ A&P ?Clinical Course as of 12/12/21 2053  ?Sat Dec 12, 2021  ?2026 Protein(!): 30 [HS]  ?  ?Clinical Course User Index ?[HS] Theron Arista, PA-C  ? ?                        ?Medical Decision Making ?Amount and/or Complexity of Data Reviewed ?Labs: ordered. Decision-making details documented in ED Course. ? ?Risk ?Prescription drug management. ? ? ?This  patient presents to the ED for concern of hematuria, this involves an extensive number of treatment options, and is a complaint that carries with it a high risk of complications and morbidity.  The differential diagnosis includes UTI, STD, nephrolithiasis, urethritis, other ? ? ?Additional history obtained:  ? ?Independent historian: Grandfather ? ?  ?Lab Tests: ? ?I ordered, viewed, and personally interpreted labs.  The pertinent results include:   ? ?Findings consistent with UTI noted on UA.  Culture ordered and pending, GC chlamydia ordered and pending. ?  ? ?Medicines ordered and prescription drug management: ? ?I ordered medication including: keflex   ? ?I have reviewed the patients home medicines and have made adjustments as needed ? ? ?Test Considered: ?Considered CT renal but given no flank pain I do not really have a high suspicion this is nephrolithiasis.   ? ?Reevaluation: ? ?After the interventions noted above, I reevaluated the patient and found unchanged ? ? ?Problems addressed / ED Course: ?36 year old presenting due to hematuria x1 day.  Associated with dysuria, no other symptoms.  He is not sexually active, GC/CT pending. Abdomen soft, UA notable for UTI findings. Culture pending. Will D/C on cipro given easier to execute that 4x daily medicine like keflex. ?  ?Social Determinants of Health: ?Medicaid  ?  ?Disposition: ? ? ?After consideration of the diagnostic results and the patients response to treatment, I feel that the patent would benefit from D/C. ? ? ? ? ? ? ? ? ?Final Clinical Impression(s) / ED Diagnoses ?Final diagnoses:  ?Acute cystitis with hematuria  ? ? ?Rx / DC Orders ?ED Discharge Orders   ? ?      Ordered  ?  ciprofloxacin (CIPRO) 500 MG tablet  Every 12 hours       ? 12/12/21 2033  ? ?  ?  ? ?  ? ? ?  ?Theron Arista, PA-C ?12/12/21 2053 ? ?  ?Jacalyn Lefevre, MD ?12/12/21 2107 ? ?

## 2021-12-12 NOTE — Discharge Instructions (Addendum)
Take Cipro twice daily for the neck 7 days.  He has a UTI, this should get better with antibiotics.  If things change or worsen he should follow-up at the ED or with his primary.  ?

## 2021-12-12 NOTE — ED Triage Notes (Signed)
Pt c/o blood in urine, states he started having trouble urinating last week and noticed blood in urine today.  ?

## 2021-12-14 ENCOUNTER — Telehealth: Payer: Self-pay

## 2021-12-14 LAB — GC/CHLAMYDIA PROBE AMP (~~LOC~~) NOT AT ARMC
Chlamydia: NEGATIVE
Comment: NEGATIVE
Comment: NORMAL
Neisseria Gonorrhea: NEGATIVE

## 2021-12-14 LAB — URINE CULTURE: Culture: NO GROWTH

## 2021-12-14 NOTE — Telephone Encounter (Signed)
Transition Care Management Unsuccessful Follow-up Telephone Call ? ?Date of discharge and from where:  12/12/2021-[facility]  ? ?Attempts:  1st Attempt ? ?Reason for unsuccessful TCM follow-up call:  Left voice message ? ?  ?

## 2021-12-16 NOTE — Telephone Encounter (Signed)
Transition Care Management Unsuccessful Follow-up Telephone Call ? ?Date of discharge and from where:  12/12/2021-[facility]  ? ?Attempts:  2nd Attempt ? ?Reason for unsuccessful TCM follow-up call:  Left voice message ? ? ? ?

## 2021-12-17 NOTE — Telephone Encounter (Signed)
Transition Care Management Unsuccessful Follow-up Telephone Call ? ?Date of discharge and from where:  12/12/2021-[facility]  ? ?Attempts:  3rd Attempt ? ?Reason for unsuccessful TCM follow-up call:  Unable to reach patient ? ? ? ?

## 2022-04-28 DIAGNOSIS — Z79899 Other long term (current) drug therapy: Secondary | ICD-10-CM | POA: Diagnosis not present

## 2022-04-28 DIAGNOSIS — E559 Vitamin D deficiency, unspecified: Secondary | ICD-10-CM | POA: Diagnosis not present

## 2022-04-28 DIAGNOSIS — G40409 Other generalized epilepsy and epileptic syndromes, not intractable, without status epilepticus: Secondary | ICD-10-CM | POA: Diagnosis not present

## 2022-04-28 DIAGNOSIS — K5904 Chronic idiopathic constipation: Secondary | ICD-10-CM | POA: Diagnosis not present

## 2022-07-13 DIAGNOSIS — G40219 Localization-related (focal) (partial) symptomatic epilepsy and epileptic syndromes with complex partial seizures, intractable, without status epilepticus: Secondary | ICD-10-CM | POA: Diagnosis not present

## 2022-07-13 DIAGNOSIS — Z79899 Other long term (current) drug therapy: Secondary | ICD-10-CM | POA: Diagnosis not present

## 2022-07-13 DIAGNOSIS — G40409 Other generalized epilepsy and epileptic syndromes, not intractable, without status epilepticus: Secondary | ICD-10-CM | POA: Diagnosis not present

## 2022-07-13 DIAGNOSIS — E559 Vitamin D deficiency, unspecified: Secondary | ICD-10-CM | POA: Diagnosis not present

## 2022-07-22 ENCOUNTER — Ambulatory Visit: Payer: Medicaid Other

## 2022-07-22 NOTE — Progress Notes (Signed)
Flu shot given

## 2022-08-03 ENCOUNTER — Ambulatory Visit: Payer: Medicaid Other | Admitting: Nurse Practitioner

## 2022-08-03 ENCOUNTER — Encounter: Payer: Self-pay | Admitting: Nurse Practitioner

## 2022-08-03 VITALS — BP 124/80 | HR 66 | Temp 98.7°F | Ht 70.0 in | Wt 155.0 lb

## 2022-08-03 DIAGNOSIS — K5909 Other constipation: Secondary | ICD-10-CM

## 2022-08-03 MED ORDER — POLYETHYLENE GLYCOL 3350 17 GM/SCOOP PO POWD: 17.0000 g | Freq: Every day | ORAL | 1 refills | Status: DC | PRN

## 2022-08-03 NOTE — Patient Instructions (Signed)

## 2022-08-03 NOTE — Progress Notes (Signed)
Acute Office Visit  Subjective:     Patient ID: Colton Bruce, male    DOB: Jan 20, 1986, 36 y.o.   MRN: [id number]  Chief Complaint  Patient presents with   Constipation    Constipation This is a chronic problem. The current episode started more than 1 year ago. The problem is unchanged. His stool frequency is 2 to 3 times per week. The stool is described as formed and firm. The patient is not on a high fiber diet. He Does not exercise regularly. There has Not been adequate water intake. Associated symptoms include abdominal pain. Pertinent negatives include no back pain, bloating, diarrhea, difficulty urinating, fecal incontinence, fever, nausea or vomiting. Risk factors include change in medication usage/dosage. He has tried fiber for the symptoms. The treatment provided no relief.     Review of Systems  Constitutional:  Negative for fever.  HENT: Negative.    Cardiovascular: Negative.   Gastrointestinal:  Positive for abdominal pain and constipation. Negative for bloating, blood in stool, diarrhea, nausea and vomiting.  Genitourinary:  Negative for difficulty urinating.  Musculoskeletal:  Negative for back pain.  Skin: Negative.  Negative for rash.  All other systems reviewed and are negative.       Objective:    BP 124/80   Pulse 66   Temp 98.7 F (37.1 C)   Ht 5\' 10"  (1.778 m)   Wt 155 lb (70.3 kg)   SpO2 98%   BMI 22.24 kg/m  Wt Readings from Last 3 Encounters:  08/03/22 155 lb (70.3 kg)  12/12/21 140 lb (63.5 kg)  10/26/21 144 lb 3.2 oz (65.4 kg)      Physical Exam Vitals and nursing note reviewed. Exam conducted with a chaperone present (grand mother).  Constitutional:      Appearance: Normal appearance.  HENT:     Head: Normocephalic.     Right Ear: External ear normal.     Left Ear: External ear normal.     Nose: Nose normal.     Mouth/Throat:     Mouth: Mucous membranes are moist.  Eyes:     Conjunctiva/sclera: Conjunctivae normal.   Cardiovascular:     Rate and Rhythm: Normal rate and regular rhythm.     Pulses: Normal pulses.     Heart sounds: Normal heart sounds.  Pulmonary:     Effort: Pulmonary effort is normal.     Breath sounds: Normal breath sounds.  Abdominal:     General: Bowel sounds are normal. There is no distension.     Palpations: There is no mass.     Tenderness: There is no abdominal tenderness. There is no guarding.  Neurological:     Mental Status: He is alert and oriented to person, place, and time.  Psychiatric:        Behavior: Behavior normal.     No results found for any visits on 08/03/22.      Assessment & Plan:  Patient presents with constipation, last Bowel movement was yesterday. Stool was formed and hard. Patient reports that he drinks about 1-2 16 oz bottle water daily. This is not new for patient . He has had chronic constipation since he was a kid. I provided education on increasing hydration, exeresis, increasing fiber, fruits and vegetables in his diet and in the future GI consult may be beneficial if symptoms are not resolved. I also made patient aware that having a bowel movement every 2 days is not abnormal. Patient knows to watch out  for blood in stool, he denies hemorrhoids, fever, back pain or other complication.   Follow up with worsening unresolved symptoms  Problem List Items Addressed This Visit       Digestive   Chronic constipation - Primary   Relevant Medications   polyethylene glycol powder (GLYCOLAX/MIRALAX) 17 GM/SCOOP powder    Meds ordered this encounter  Medications   polyethylene glycol powder (GLYCOLAX/MIRALAX) 17 GM/SCOOP powder    Sig: Take 17 g by mouth daily as needed.    Dispense:  3350 g    Refill:  1    Order Specific Question:   Supervising Provider    Answer:   Mechele Claude [[id number]]    Return if symptoms worsen or fail to improve.  Daryll Drown, NP

## 2022-10-27 ENCOUNTER — Encounter: Payer: Medicaid Other | Admitting: Family Medicine

## 2022-11-04 ENCOUNTER — Encounter: Payer: Self-pay | Admitting: Family Medicine

## 2022-11-04 ENCOUNTER — Ambulatory Visit (INDEPENDENT_AMBULATORY_CARE_PROVIDER_SITE_OTHER): Payer: Medicaid Other | Admitting: Family Medicine

## 2022-11-04 VITALS — BP 124/80 | HR 88 | Temp 98.3°F | Ht 70.0 in | Wt 156.1 lb

## 2022-11-04 DIAGNOSIS — Z0000 Encounter for general adult medical examination without abnormal findings: Secondary | ICD-10-CM

## 2022-11-04 DIAGNOSIS — E559 Vitamin D deficiency, unspecified: Secondary | ICD-10-CM | POA: Diagnosis not present

## 2022-11-04 DIAGNOSIS — Z0001 Encounter for general adult medical examination with abnormal findings: Secondary | ICD-10-CM

## 2022-11-04 DIAGNOSIS — G40909 Epilepsy, unspecified, not intractable, without status epilepticus: Secondary | ICD-10-CM | POA: Diagnosis not present

## 2022-11-04 MED ORDER — BRIVARACETAM 100 MG PO TABS: 100.0000 mg | ORAL_TABLET | Freq: Two times a day (BID) | ORAL | 1 refills | Status: DC

## 2022-11-04 MED ORDER — LAMOTRIGINE ER 200 MG PO TB24: 400.0000 mg | ORAL_TABLET | Freq: Every day | ORAL | 1 refills | Status: DC

## 2022-11-04 NOTE — Patient Instructions (Signed)
Health Maintenance, Male Adopting a healthy lifestyle and getting preventive care are important in promoting health and wellness. Ask your health care provider about: The right schedule for you to have regular tests and exams. Things you can do on your own to prevent diseases and keep yourself healthy. What should I know about diet, weight, and exercise? Eat a healthy diet  Eat a diet that includes plenty of vegetables, fruits, low-fat dairy products, and lean protein. Do not eat a lot of foods that are high in solid fats, added sugars, or sodium. Maintain a healthy weight Body mass index (BMI) is a measurement that can be used to identify possible weight problems. It estimates body fat based on height and weight. Your health care provider can help determine your BMI and help you achieve or maintain a healthy weight. Get regular exercise Get regular exercise. This is one of the most important things you can do for your health. Most adults should: Exercise for at least 150 minutes each week. The exercise should increase your heart rate and make you sweat (moderate-intensity exercise). Do strengthening exercises at least twice a week. This is in addition to the moderate-intensity exercise. Spend less time sitting. Even light physical activity can be beneficial. Watch cholesterol and blood lipids Have your blood tested for lipids and cholesterol at 36 years of age, then have this test every 5 years. You may need to have your cholesterol levels checked more often if: Your lipid or cholesterol levels are high. You are older than 36 years of age. You are at high risk for heart disease. What should I know about cancer screening? Many types of cancers can be detected early and may often be prevented. Depending on your health history and family history, you may need to have cancer screening at various ages. This may include screening for: Colorectal cancer. Prostate cancer. Skin cancer. Lung  cancer. What should I know about heart disease, diabetes, and high blood pressure? Blood pressure and heart disease High blood pressure causes heart disease and increases the risk of stroke. This is more likely to develop in people who have high blood pressure readings or are overweight. Talk with your health care provider about your target blood pressure readings. Have your blood pressure checked: Every 3-5 years if you are 18-39 years of age. Every year if you are 40 years old or older. If you are between the ages of 65 and 75 and are a current or former smoker, ask your health care provider if you should have a one-time screening for abdominal aortic aneurysm (AAA). Diabetes Have regular diabetes screenings. This checks your fasting blood sugar level. Have the screening done: Once every three years after age 45 if you are at a normal weight and have a low risk for diabetes. More often and at a younger age if you are overweight or have a high risk for diabetes. What should I know about preventing infection? Hepatitis B If you have a higher risk for hepatitis B, you should be screened for this virus. Talk with your health care provider to find out if you are at risk for hepatitis B infection. Hepatitis C Blood testing is recommended for: Everyone born from 1945 through 1965. Anyone with known risk factors for hepatitis C. Sexually transmitted infections (STIs) You should be screened each year for STIs, including gonorrhea and chlamydia, if: You are sexually active and are younger than 36 years of age. You are older than 36 years of age and your   health care provider tells you that you are at risk for this type of infection. Your sexual activity has changed since you were last screened, and you are at increased risk for chlamydia or gonorrhea. Ask your health care provider if you are at risk. Ask your health care provider about whether you are at high risk for HIV. Your health care provider  may recommend a prescription medicine to help prevent HIV infection. If you choose to take medicine to prevent HIV, you should first get tested for HIV. You should then be tested every 3 months for as long as you are taking the medicine. Follow these instructions at home: Alcohol use Do not drink alcohol if your health care provider tells you not to drink. If you drink alcohol: Limit how much you have to 0-2 drinks a day. Know how much alcohol is in your drink. In the [place] one drink equals one 12 oz bottle of beer (355 mL), one 5 oz glass of wine (148 mL), or one 1 oz glass of hard liquor (44 mL). Lifestyle Do not use any products that contain nicotine or tobacco. These products include cigarettes, chewing tobacco, and vaping devices, such as e-cigarettes. If you need help quitting, ask your health care provider. Do not use street drugs. Do not share needles. Ask your health care provider for help if you need support or information about quitting drugs. General instructions Schedule regular health, dental, and eye exams. Stay current with your vaccines. Tell your health care provider if: You often feel depressed. You have ever been abused or do not feel safe at home. Summary Adopting a healthy lifestyle and getting preventive care are important in promoting health and wellness. Follow your health care provider's instructions about healthy diet, exercising, and getting tested or screened for diseases. Follow your health care provider's instructions on monitoring your cholesterol and blood pressure. This information is not intended to replace advice given to you by your health care provider. Make sure you discuss any questions you have with your health care provider. Document Revised: 01/26/2021 Document Reviewed: 01/26/2021 Elsevier Patient Education  2023 Elsevier Inc.  

## 2022-11-04 NOTE — Progress Notes (Signed)
Complete physical exam  Patient: Colton Bruce   DOB: 12-26-1985   37 y.o. Male  MRN: [id number]  Subjective:    Chief Complaint  Patient presents with   Annual Exam   Here with grandparents today.   Colton Bruce is a 37 y.o. male who presents today for a complete physical exam. He reports consuming a general diet. The patient has a physically strenuous job, but has no regular exercise apart from work.  He generally feels well. He reports sleeping well. He does not have additional problems to discuss today.   He is in need a referral to a new neurologist. He was being seen by [facility] but they have now closed the practice. He has been seizure free for years on Briviact and lamictal.   Most recent fall risk assessment:    11/04/2022    1:37 PM  [place] in the past year? 0     Most recent depression screenings:    11/04/2022    1:37 PM 08/03/2022    8:59 AM  PHQ 2/9 Scores  PHQ - 2 Score 0 0  PHQ- 9 Score 0 0      Past Medical History:  Diagnosis Date   Mental retardation    Seizures [facility])       Patient Care Team: Gwenlyn Perking, FNP as PCP - General (Family Medicine)   Outpatient Medications Prior to Visit  Medication Sig   Brivaracetam 100 MG TABS Take 1 tablet by mouth 2 (two) times daily.   LamoTRIgine 200 MG TB24 24 hour tablet Take 400 mg by mouth daily.   polyethylene glycol powder (GLYCOLAX/MIRALAX) 17 GM/SCOOP powder Take 17 g by mouth daily as needed.   Vitamin D, Ergocalciferol, (DRISDOL) 1.25 MG (50000 UNIT) CAPS capsule Take 1 capsule (50,000 Units total) by mouth every 7 (seven) days.   [DISCONTINUED] ciprofloxacin (CIPRO) 500 MG tablet Take 1 tablet (500 mg total) by mouth every 12 (twelve) hours.   [DISCONTINUED] lactulose (CHRONULAC) 10 GM/15ML solution TAKE 2 TABLESPOONFULS AT BEDTIME   No facility-administered medications prior to visit.    ROS        Objective:     BP 124/80   Pulse 88   Temp 98.3 F  (36.8 C) (Temporal)   Ht 5' 10"$  (1.778 m)   Wt 156 lb 2 oz (70.8 kg)   SpO2 95%   BMI 22.40 kg/m    Physical Exam Vitals and nursing note reviewed.  Constitutional:      General: He is not in acute distress.    Appearance: He is not ill-appearing.  HENT:     Head: Normocephalic.     Right Ear: Tympanic membrane, ear canal and external ear normal.     Left Ear: Tympanic membrane, ear canal and external ear normal.     Nose: Nose normal.     Mouth/Throat:     Mouth: Mucous membranes are moist.     Pharynx: Oropharynx is clear.  Eyes:     Extraocular Movements: Extraocular movements intact.     Conjunctiva/sclera: Conjunctivae normal.     Pupils: Pupils are equal, round, and reactive to light.  Neck:     Thyroid: No thyroid mass, thyromegaly or thyroid tenderness.     Vascular: No carotid bruit.  Cardiovascular:     Rate and Rhythm: Normal rate and regular rhythm.     Pulses: Normal pulses.     Heart sounds: Normal heart sounds.  No murmur heard.    No friction rub. No gallop.  Pulmonary:     Effort: Pulmonary effort is normal.     Breath sounds: Normal breath sounds.  Abdominal:     General: Bowel sounds are normal. There is no distension.     Palpations: Abdomen is soft. There is no mass.     Tenderness: There is no abdominal tenderness. There is no guarding.  Musculoskeletal:     Cervical back: Neck supple. No rigidity or tenderness.     Right lower leg: No edema.     Left lower leg: No edema.  Lymphadenopathy:     Cervical: No cervical adenopathy.  Skin:    General: Skin is warm and dry.     Capillary Refill: Capillary refill takes less than 2 seconds.     Findings: No lesion or rash.  Neurological:     Mental Status: He is alert and oriented to person, place, and time. Mental status is at baseline.     Cranial Nerves: No cranial nerve deficit.     Motor: No weakness.  Psychiatric:        Mood and Affect: Mood normal.        Behavior: Behavior normal.       No results found for any visits on 11/04/22.     Assessment & Plan:    Routine Health Maintenance and Physical Exam  Colton Bruce was seen today for annual exam.  Diagnoses and all orders for this visit:  Routine general medical examination at a health care facility Nonfasting labs pending.  -     CBC with Differential/Platelet -     CMP14+EGFR -     Lipid panel -     TSH  Seizure disorder ([place]) Well controlled on current regimen. Referral to neurology for further management as [facility] neurology has closed. Discussed will provide refills until established with new neurologist.  -     brivaracetam (BRIVIACT) 100 MG TABS tablet; Take 1 tablet (100 mg total) by mouth 2 (two) times daily. -     LamoTRIgine 200 MG TB24 24 hour tablet; Take 2 tablets (400 mg total) by mouth daily. -     Ambulatory referral to Neurology  Vitamin D deficiency On supplement. Labs pending.  -     VITAMIN D 25 Hydroxy (Vit-D Deficiency, Fractures)    Immunization History  Administered Date(s) Administered   Hepatitis B 01/02/2004   Influenza,inj,Quad PF,6+ Mos 07/28/2016, 07/25/2017, 10/18/2018, 08/04/2021   Influenza-Unspecified 06/22/2020   Moderna SARS-COV2 Booster Vaccination 11/26/2020   Moderna Sars-Covid-2 Vaccination 12/13/2019, 01/10/2020   Td 01/02/2004, 10/23/2019    Health Maintenance  Topic Date Due   INFLUENZA VACCINE  12/19/2022 (Originally 04/20/2022)   COVID-19 Vaccine (4 - 2023-24 season) 11/20/2023 (Originally 05/21/2022)   DTaP/Tdap/Td (3 - Tdap) 10/22/2029   Hepatitis C Screening  Completed   HIV Screening  Completed   HPV VACCINES  Aged Out    Discussed health benefits of physical activity, and encouraged him to engage in regular exercise appropriate for his age and condition.  Problem List Items Addressed This Visit       Nervous and Auditory   Seizure disorder (HCC)   Relevant Medications   brivaracetam (BRIVIACT) 100 MG TABS tablet   LamoTRIgine 200 MG TB24 24 hour  tablet   Other Relevant Orders   Ambulatory referral to Neurology     Other   Vitamin D deficiency   Relevant Orders   CBC with Differential/Platelet  CMP14+EGFR   Lipid panel   TSH   VITAMIN D 25 Hydroxy (Vit-D Deficiency, Fractures)   Other Visit Diagnoses     Routine general medical examination at a health care facility    -  Primary      Return in 1 year (on 11/05/2023) for CPE.   The patient indicates understanding of these issues and agrees with the plan.   Gwenlyn Perking, FNP

## 2022-11-05 LAB — CMP14+EGFR
ALT: 33 IU/L (ref 0–44)
AST: 22 IU/L (ref 0–40)
Albumin/Globulin Ratio: 1.9 (ref 1.2–2.2)
Albumin: 5.3 g/dL — ABNORMAL HIGH (ref 4.1–5.1)
Alkaline Phosphatase: 115 IU/L (ref 44–121)
BUN/Creatinine Ratio: 20 (ref 9–20)
BUN: 22 mg/dL — ABNORMAL HIGH (ref 6–20)
Bilirubin Total: 0.6 mg/dL (ref 0.0–1.2)
CO2: 23 mmol/L (ref 20–29)
Calcium: 10.1 mg/dL (ref 8.7–10.2)
Chloride: 97 mmol/L (ref 96–106)
Creatinine, Ser: 1.1 mg/dL (ref 0.76–1.27)
Globulin, Total: 2.8 g/dL (ref 1.5–4.5)
Glucose: 89 mg/dL (ref 70–99)
Potassium: 4.6 mmol/L (ref 3.5–5.2)
Sodium: 137 mmol/L (ref 134–144)
Total Protein: 8.1 g/dL (ref 6.0–8.5)
eGFR: 89 mL/min/{1.73_m2} (ref >59)

## 2022-11-05 LAB — CBC WITH DIFFERENTIAL/PLATELET
Basophils Absolute: 0.1 10*3/uL (ref 0.0–0.2)
Basos: 1 %
EOS (ABSOLUTE): 0.2 10*3/uL (ref 0.0–0.4)
Eos: 2 %
Hematocrit: 48 % (ref 37.5–51.0)
Hemoglobin: 16.2 g/dL (ref 13.0–17.7)
Immature Grans (Abs): 0 10*3/uL (ref 0.0–0.1)
Immature Granulocytes: 0 %
Lymphocytes Absolute: 2.1 10*3/uL (ref 0.7–3.1)
Lymphs: 26 %
MCH: 29.8 pg (ref 26.6–33.0)
MCHC: 33.8 g/dL (ref 31.5–35.7)
MCV: 88 fL (ref 79–97)
Monocytes Absolute: 0.8 10*3/uL (ref 0.1–0.9)
Monocytes: 10 %
Neutrophils Absolute: 5 10*3/uL (ref 1.4–7.0)
Neutrophils: 61 %
Platelets: 340 10*3/uL (ref 150–450)
RBC: 5.43 x10E6/uL (ref 4.14–5.80)
RDW: 13.2 % (ref 11.6–15.4)
WBC: 8.1 10*3/uL (ref 3.4–10.8)

## 2022-11-05 LAB — LIPID PANEL
Chol/HDL Ratio: 4.1 ratio (ref 0.0–5.0)
Cholesterol, Total: 175 mg/dL (ref 100–199)
HDL: 43 mg/dL (ref >39)
LDL Chol Calc (NIH): 114 mg/dL — ABNORMAL HIGH (ref 0–99)
Triglycerides: 100 mg/dL (ref 0–149)
VLDL Cholesterol Cal: 18 mg/dL (ref 5–40)

## 2022-11-05 LAB — VITAMIN D 25 HYDROXY (VIT D DEFICIENCY, FRACTURES): Vit D, 25-Hydroxy: 93.7 ng/mL (ref 30.0–100.0)

## 2022-11-05 LAB — TSH: TSH: 2.28 u[IU]/mL (ref 0.450–4.500)

## 2022-11-09 ENCOUNTER — Telehealth: Payer: Self-pay | Admitting: Family Medicine

## 2022-11-09 NOTE — Telephone Encounter (Signed)
Spoke to patsy - aware and verbalizes understanding.

## 2022-12-08 ENCOUNTER — Ambulatory Visit: Payer: Medicaid Other | Admitting: Neurology

## 2022-12-08 ENCOUNTER — Encounter: Payer: Self-pay | Admitting: Neurology

## 2022-12-08 VITALS — BP 131/82 | HR 71 | Ht 70.0 in | Wt 155.5 lb

## 2022-12-08 DIAGNOSIS — G40209 Localization-related (focal) (partial) symptomatic epilepsy and epileptic syndromes with complex partial seizures, not intractable, without status epilepticus: Secondary | ICD-10-CM

## 2022-12-08 DIAGNOSIS — G809 Cerebral palsy, unspecified: Secondary | ICD-10-CM | POA: Diagnosis not present

## 2022-12-08 DIAGNOSIS — R6250 Unspecified lack of expected normal physiological development in childhood: Secondary | ICD-10-CM | POA: Diagnosis not present

## 2022-12-08 DIAGNOSIS — Z5181 Encounter for therapeutic drug level monitoring: Secondary | ICD-10-CM

## 2022-12-08 DIAGNOSIS — G40909 Epilepsy, unspecified, not intractable, without status epilepticus: Secondary | ICD-10-CM | POA: Diagnosis not present

## 2022-12-08 MED ORDER — LAMOTRIGINE ER 200 MG PO TB24: 400.0000 mg | ORAL_TABLET | Freq: Every day | ORAL | 11 refills | Status: DC

## 2022-12-08 MED ORDER — BRIVARACETAM 100 MG PO TABS: 100.0000 mg | ORAL_TABLET | Freq: Two times a day (BID) | ORAL | 5 refills | Status: DC

## 2022-12-08 NOTE — Patient Instructions (Signed)
Continue with lamotrigine XR 400 mg daily, refill given  Continue with Briviact 100 mg twice daily, refill given His latest BMP done a month ago was within normal limits I will check a lamotrigine level Follow-up in 1 year or sooner if worse.

## 2022-12-08 NOTE — Progress Notes (Signed)
[facility]  PATIENT: Colton Bruce DOB: [birth year]  REQUESTING CLINICIAN: Gwenlyn Perking, FNP HISTORY FROM: Patient, grandparents and chart review  REASON FOR VISIT: Epilepsy, here to establish care   HISTORICAL  CHIEF COMPLAINT:  Chief Complaint  Patient presents with   New Patient (Initial Visit)    Rm 12. Accompanied by grandparents. NP internal referral for Seizure disorder.    HISTORY OF PRESENT ILLNESS:  This 37 year old gentleman past medical history of cerebral palsy, seizures who is presenting with both grandparents to establish care.  Patient was previously well managed by Dr. Freddie Apley office.  For his seizure he is on Briviact 100 mg twice daily and lamotrigine XR 400 mg daily.  He has not had a seizure for the past 4 years. His seizures were described as lipsmacking and hand rubbing with unresponsiveness.  [place] parents denies any generalized convulsion.  Overall he is doing very well.  Currently no complaints or concerns.  Handedness: Right   Onset: All his life   Seizure Type: lipsmacking, hand automatism and unresponsiveness  Current frequency: None for the past 4 years  Any injuries from seizures: Denies  Seizure risk factors: History of prematurity, developmental delay, cerebral palsy   Previous ASMs: Tegretol, Lamotrigine   Currenty ASMs: Briviact 100 mg BID and Lamotrigine XR 400 mg daily   ASMs side effects: Denies   Brain Images: None available for review   Previous EEGs: None available for review    OTHER MEDICAL CONDITIONS: Seizure disorder, cerebral palsy, development delay   REVIEW OF SYSTEMS: Full 14 system review of systems performed and negative with exception of: as noted in the HPI   ALLERGIES: Allergies  Allergen Reactions   Penicillins Other (See Comments)    seizure    HOME MEDICATIONS: Outpatient Medications Prior to Visit  Medication Sig Dispense Refill   polyethylene glycol (MIRALAX /  GLYCOLAX) 17 g packet Take 17 g by mouth every other day.     Vitamin D, Ergocalciferol, (DRISDOL) 1.25 MG (50000 UNIT) CAPS capsule Take 1 capsule (50,000 Units total) by mouth every 7 (seven) days. 12 capsule 3   brivaracetam (BRIVIACT) 100 MG TABS tablet Take 1 tablet (100 mg total) by mouth 2 (two) times daily. 60 tablet 1   LamoTRIgine 200 MG TB24 24 hour tablet Take 2 tablets (400 mg total) by mouth daily. 30 tablet 1   polyethylene glycol powder (GLYCOLAX/MIRALAX) 17 GM/SCOOP powder Take 17 g by mouth daily as needed. 3350 g 1   No facility-administered medications prior to visit.    PAST MEDICAL HISTORY: Past Medical History:  Diagnosis Date   Mental retardation    Seizures ([place])     PAST SURGICAL HISTORY: Past Surgical History:  Procedure Laterality Date   FINGER SURGERY      FAMILY HISTORY: Family History  Problem Relation Age of Onset   COPD Paternal Grandmother    Heart disease Paternal Grandfather    Hyperlipidemia Paternal Grandfather     SOCIAL HISTORY: Social History   Socioeconomic History   Marital status: Single    Spouse name: Not on file   Number of children: Not on file   Years of education: Not on file   Highest education level: Not on file  Occupational History   Not on file  Tobacco Use   Smoking status: Never   Smokeless tobacco: Never  Substance and Sexual Activity   Alcohol use: No   Drug use: No   Sexual activity: Not on  file  Other Topics Concern   Not on file  Social History Narrative   Not on file   Social Determinants of Health   Financial Resource Strain: Not on file  Food Insecurity: Not on file  Transportation Needs: Not on file  Physical Activity: Not on file  Stress: Not on file  Social Connections: Not on file  Intimate Partner Violence: Not on file    PHYSICAL EXAM  GENERAL EXAM/CONSTITUTIONAL: Vitals:  Vitals:   12/08/22 0750  BP: 131/82  Pulse: 71  Weight: 155 lb 8 oz (70.5 kg)  Height: 5\' 10"  (1.778 m)    Body mass index is 22.31 kg/m. Wt Readings from Last 3 Encounters:  12/08/22 155 lb 8 oz (70.5 kg)  11/04/22 156 lb 2 oz (70.8 kg)  08/03/22 155 lb (70.3 kg)   Patient is in no distress; well developed, nourished and groomed; neck is supple  EYES: Visual fields full to confrontation, Extraocular movements intacts,  No results found.  MUSCULOSKELETAL: Gait, strength, tone, movements noted in Neurologic exam below  NEUROLOGIC: MENTAL STATUS:      No data to display         awake, alert, oriented to person, place but not date Able to answer simple questions, able to name, repeat and comprehend  Difficulty with calculating the number of quarter in $1 and $2.   CRANIAL NERVE:  2nd, 3rd, 4th, 6th - Visual fields full to confrontation, extraocular muscles intact, no nystagmus 5th - facial sensation symmetric 7th - facial strength symmetric 8th - hearing intact 9th - palate elevates symmetrically, uvula midline 11th - shoulder shrug symmetric 12th - tongue protrusion midline  MOTOR:  normal bulk and tone, full strength in the BUE, BLE  SENSORY:  normal and symmetric to light touch  COORDINATION:  finger-nose-finger, fine finger movements normal  REFLEXES:  deep tendon reflexes present and symmetric  GAIT/STATION:  normal     DIAGNOSTIC DATA (LABS, IMAGING, TESTING) - I reviewed patient records, labs, notes, testing and imaging myself where available.  Lab Results  Component Value Date   WBC 8.1 11/04/2022   HGB 16.2 11/04/2022   HCT 48.0 11/04/2022   MCV 88 11/04/2022   PLT 340 11/04/2022      Component Value Date/Time   NA 137 11/04/2022 1415   K 4.6 11/04/2022 1415   CL 97 11/04/2022 1415   CO2 23 11/04/2022 1415   GLUCOSE 89 11/04/2022 1415   BUN 22 (H) 11/04/2022 1415   CREATININE 1.10 11/04/2022 1415   CALCIUM 10.1 11/04/2022 1415   PROT 8.1 11/04/2022 1415   ALBUMIN 5.3 (H) 11/04/2022 1415   AST 22 11/04/2022 1415   ALT 33 11/04/2022  1415   ALKPHOS 115 11/04/2022 1415   BILITOT 0.6 11/04/2022 1415   GFRNONAA 86 10/23/2020 1027   GFRAA 100 10/23/2020 1027   Lab Results  Component Value Date   CHOL 175 11/04/2022   HDL 43 11/04/2022   LDLCALC 114 (H) 11/04/2022   TRIG 100 11/04/2022   No results found for: "HGBA1C" No results found for: "VITAMINB12" Lab Results  Component Value Date   TSH 2.280 11/04/2022    ASSESSMENT AND PLAN  37 y.o. year old male  with history of epilepsy, cerebral palsy, developmental delay who is presenting to establish care.  Patient epilepsy is well-maintained on lamotrigine XR 400 mg daily and Briviact 100 mg twice daily.  His last seizure was more than 4 years ago.  At the moment we  will continue current medications.  I will check a lamotrigine level and patient will follow-up on a yearly basis.  Advised grandparents to contact me if he has a breakthrough seizure.  They voiced understanding.   1. Partial symptomatic epilepsy with complex partial seizures, not intractable, without status epilepticus ([place])   2. Therapeutic drug monitoring   3. Cerebral palsy, unspecified type ([place])   4. Developmental delay     Patient Instructions  Continue with lamotrigine XR 400 mg daily, refill given  Continue with Briviact 100 mg twice daily, refill given His latest BMP done a month ago was within normal limits I will check a lamotrigine level Follow-up in 1 year or sooner if worse.   Per [facility] statutes, patients with seizures are not allowed to drive until they have been seizure-free for six months.  Other recommendations include using caution when using heavy equipment or power tools. Avoid working on ladders or at heights. Take showers instead of baths.  Do not swim alone.  Ensure the water temperature is not too high on the home water heater. Do not go swimming alone. Do not lock yourself in a room alone (i.e. bathroom). When caring for infants or small children, sit down when  holding, feeding, or changing them to minimize risk of injury to the child in the event you have a seizure. Maintain good sleep hygiene. Avoid alcohol.  Also recommend adequate sleep, hydration, good diet and minimize stress.   During the Seizure  - First, ensure adequate ventilation and place patients on the floor on their left side  Loosen clothing around the neck and ensure the airway is patent. If the patient is clenching the teeth, do not force the mouth open with any object as this can cause severe damage - Remove all items from the surrounding that can be hazardous. The patient may be oblivious to what's happening and may not even know what he or she is doing. If the patient is confused and wandering, either gently guide him/her away and block access to outside areas - Reassure the individual and be comforting - Call 911. In most cases, the seizure ends before EMS arrives. However, there are cases when seizures may last over 3 to 5 minutes. Or the individual may have developed breathing difficulties or severe injuries. If a pregnant patient or a person with diabetes develops a seizure, it is prudent to call an ambulance. - Finally, if the patient does not regain full consciousness, then call EMS. Most patients will remain confused for about 45 to 90 minutes after a seizure, so you must use judgment in calling for help. - Avoid restraints but make sure the patient is in a bed with padded side rails - Place the individual in a lateral position with the neck slightly flexed; this will help the saliva drain from the mouth and prevent the tongue from falling backward - Remove all nearby furniture and other hazards from the area - Provide verbal assurance as the individual is regaining consciousness - Provide the patient with privacy if possible - Call for help and start treatment as ordered by the caregiver   After the Seizure (Postictal Stage)  After a seizure, most patients experience  confusion, fatigue, muscle pain and/or a headache. Thus, one should permit the individual to sleep. For the next few days, reassurance is essential. Being calm and helping reorient the person is also of importance.  Most seizures are painless and end spontaneously. Seizures are not harmful to  others but can lead to complications such as stress on the lungs, brain and the heart. Individuals with prior lung problems may develop labored breathing and respiratory distress.     Orders Placed This Encounter  Procedures   Lamotrigine level    Meds ordered this encounter  Medications   LamoTRIgine 200 MG TB24 24 hour tablet    Sig: Take 2 tablets (400 mg total) by mouth daily.    Dispense:  60 tablet    Refill:  11   brivaracetam (BRIVIACT) 100 MG TABS tablet    Sig: Take 1 tablet (100 mg total) by mouth 2 (two) times daily.    Dispense:  60 tablet    Refill:  5    Return in about 1 year (around 12/08/2023).    Alric Ran, MD 12/08/2022, 8:30 AM  [facility] [address] [place], [place] [address] [phone]

## 2022-12-11 LAB — LAMOTRIGINE LEVEL: Lamotrigine Lvl: 14.8 ug/mL (ref 2.0–20.0)

## 2023-04-13 DIAGNOSIS — H5213 Myopia, bilateral: Secondary | ICD-10-CM | POA: Diagnosis not present

## 2023-06-07 ENCOUNTER — Other Ambulatory Visit: Payer: Self-pay | Admitting: Family Medicine

## 2023-06-07 ENCOUNTER — Other Ambulatory Visit: Payer: Self-pay | Admitting: Neurology

## 2023-06-07 ENCOUNTER — Telehealth: Payer: Self-pay | Admitting: Neurology

## 2023-06-07 DIAGNOSIS — G40209 Localization-related (focal) (partial) symptomatic epilepsy and epileptic syndromes with complex partial seizures, not intractable, without status epilepticus: Secondary | ICD-10-CM

## 2023-06-07 NOTE — Telephone Encounter (Signed)
Dr. Teresa Coombs,  Please see below.  I am showing that the drug store dispensed some today: Dispenses   Called the pharmacy they picked it up today at the drug store.  They stated the last refill from up said Dr. Teresa Coombs didn't want to fill and said pcp fill and now pcp says send to neuro. Representation at pharmacy is saying one can be on file so if its a weekend they have one on file.  Dispensed Days Supply Quantity Provider Pharmacy  BRIVIACT 100MG  TAB 06/07/2023 30 60 tablet Gabriel Earing, FNP [facility]  BRIVIACT 100MG  TAB 05/09/2023 30 60 tablet Windell Norfolk, MD [facility]  BRIVIACT 100MG  TAB 04/11/2023 30 60 tablet Windell Norfolk, MD [facility]  BRIVIACT 100MG  TAB 03/11/2023 30 60 tablet Windell Norfolk, MD [facility]  BRIVIACT 100MG  TAB 02/10/2023 30 60 tablet Windell Norfolk, MD [facility]  BRIVIACT 100MG  TAB 01/14/2023 30 60 tablet Windell Norfolk, MD [facility]  BRIVIACT 100MG  TAB 12/08/2022 30 60 tablet Windell Norfolk, MD [facility]  BRIVIACT 100MG  TAB 11/04/2022 30 60 tablet Gabriel Earing, FNP [facility]  BRIVIACT 100MG  TAB 09/24/2022 30 60 tablet Jorge Mandril, NP [facility]  BRIVIACT 100MG  TAB 08/23/2022 30 60 tablet Jorge Mandril, NP [facility]  BRIVIACT 100MG  TAB 07/22/2022 30 60 tablet Jorge Mandril, NP [facility]  BRIVIACT 100MG  TAB 06/25/2022 30 60 tablet Jorge Mandril, NP [facility].Marland KitchenMarland Kitchen

## 2023-06-07 NOTE — Telephone Encounter (Signed)
Pt's mother called stating that the pt's  brivaracetam (BRIVIACT) 100 MG TABS tablet does not have refills and is wanting to know if a rx can be sent to [facility] so that it is there already when they need to fill it.

## 2023-06-08 ENCOUNTER — Other Ambulatory Visit: Payer: Self-pay | Admitting: Neurology

## 2023-06-08 DIAGNOSIS — G40209 Localization-related (focal) (partial) symptomatic epilepsy and epileptic syndromes with complex partial seizures, not intractable, without status epilepticus: Secondary | ICD-10-CM

## 2023-06-08 MED ORDER — BRIVARACETAM 100 MG PO TABS: 100.0000 mg | ORAL_TABLET | Freq: Two times a day (BID) | ORAL | 5 refills | Status: DC

## 2023-06-08 NOTE — Telephone Encounter (Signed)
Refills sent to pharmacy. 

## 2023-08-01 ENCOUNTER — Telehealth: Payer: Self-pay | Admitting: Family Medicine

## 2023-08-01 ENCOUNTER — Other Ambulatory Visit: Payer: Self-pay | Admitting: Family Medicine

## 2023-08-01 DIAGNOSIS — K5909 Other constipation: Secondary | ICD-10-CM

## 2023-08-01 NOTE — Telephone Encounter (Signed)
Copied from CRM [id number]. Topic: Clinical - Medication Refill >> Aug 01, 2023  1:37 PM Maxwell Marion wrote: Most Recent Primary Care Visit:  Provider: Gabriel Earing  Department: [facility]  Visit Type: PHYSICAL  Date: 11/04/2022  Medication: ***  Has the patient contacted their pharmacy?  (Agent: If no, request that the patient contact the pharmacy for the refill. If patient does not wish to contact the pharmacy document the reason why and proceed with request.) (Agent: If yes, when and what did the pharmacy advise?)  Is this the correct pharmacy for this prescription?  If no, delete pharmacy and type the correct one.  This is the patient's preferred pharmacy:  [facility] [place] - [address] [place] [address] Phone: [phone] Fax: [phone]   Has the prescription been filled recently?   Is the patient out of the medication?   Has the patient been seen for an appointment in the last year OR does the patient have an upcoming appointment?   Can we respond through MyChart?   Agent: Please be advised that Rx refills may take up to 3 business days. We ask that you follow-up with your pharmacy.

## 2023-08-01 NOTE — Telephone Encounter (Signed)
It looks like pt gets his medications from neurology so he needs to contact them. I tried to call pt but no answer and could not leave VM.

## 2023-08-02 ENCOUNTER — Encounter: Payer: Self-pay | Admitting: *Deleted

## 2023-08-08 ENCOUNTER — Ambulatory Visit (INDEPENDENT_AMBULATORY_CARE_PROVIDER_SITE_OTHER): Payer: Medicaid Other

## 2023-08-08 DIAGNOSIS — Z23 Encounter for immunization: Secondary | ICD-10-CM

## 2023-11-04 ENCOUNTER — Ambulatory Visit: Payer: Medicaid Other | Admitting: Family Medicine

## 2023-11-04 ENCOUNTER — Encounter: Payer: Self-pay | Admitting: Family Medicine

## 2023-11-04 VITALS — BP 124/81 | HR 71 | Temp 98.3°F | Ht 70.0 in | Wt 156.0 lb

## 2023-11-04 DIAGNOSIS — E559 Vitamin D deficiency, unspecified: Secondary | ICD-10-CM

## 2023-11-04 DIAGNOSIS — G40209 Localization-related (focal) (partial) symptomatic epilepsy and epileptic syndromes with complex partial seizures, not intractable, without status epilepticus: Secondary | ICD-10-CM | POA: Diagnosis not present

## 2023-11-04 DIAGNOSIS — R6889 Other general symptoms and signs: Secondary | ICD-10-CM | POA: Diagnosis not present

## 2023-11-04 DIAGNOSIS — Z0001 Encounter for general adult medical examination with abnormal findings: Secondary | ICD-10-CM

## 2023-11-04 DIAGNOSIS — G809 Cerebral palsy, unspecified: Secondary | ICD-10-CM | POA: Diagnosis not present

## 2023-11-04 DIAGNOSIS — K5909 Other constipation: Secondary | ICD-10-CM

## 2023-11-04 DIAGNOSIS — Z0100 Encounter for examination of eyes and vision without abnormal findings: Secondary | ICD-10-CM

## 2023-11-04 MED ORDER — LUBIPROSTONE 24 MCG PO CAPS: 24.0000 ug | ORAL_CAPSULE | Freq: Two times a day (BID) | ORAL | 3 refills | Status: DC

## 2023-11-04 NOTE — Progress Notes (Signed)
Complete physical exam  Patient: Colton Bruce   DOB: 18-Sep-1986   38 y.o. Male  MRN: [id number]  Subjective:    Chief Complaint  Patient presents with   Annual Exam   Here with grandmother today.   Raza A Coleson is a 38 y.o. male who presents today for a complete physical exam. He reports consuming a general diet.  He walks daily.  He generally feels well. He reports sleeping well. He does have additional problems to discuss today.    Hx of chronic constipation. He has a BM once a week. Large BM. Denies abdominal pain, straining. Takes miralax daily without relief. Failed lactulose in the past. Eats high fiber diet and stays well hydrated.   Most recent fall risk assessment:    11/04/2022    1:37 PM  Fall Risk   Falls in the past year? 0     Most recent depression screenings:    11/04/2022    1:37 PM 08/03/2022    8:59 AM  PHQ 2/9 Scores  PHQ - 2 Score 0 0  PHQ- 9 Score 0 0    Vision:Not within last year  and Dental: No current dental problems and No regular dental care   Past Medical History:  Diagnosis Date   Mental retardation    Seizures [facility])       Patient Care Team: Gabriel Earing, FNP as PCP - General (Family Medicine)   Outpatient Medications Prior to Visit  Medication Sig   brivaracetam (BRIVIACT) 100 MG TABS tablet Take 1 tablet (100 mg total) by mouth 2 (two) times daily.   LamoTRIgine 200 MG TB24 24 hour tablet Take 2 tablets (400 mg total) by mouth daily.   polyethylene glycol (MIRALAX / GLYCOLAX) 17 g packet Take 17 g by mouth every other day.   Vitamin D, Ergocalciferol, (DRISDOL) 1.25 MG (50000 UNIT) CAPS capsule Take 1 capsule (50,000 Units total) by mouth every 7 (seven) days.   No facility-administered medications prior to visit.    ROS Negative unless specially indicated above in HPI.       Objective:     BP 124/81   Pulse 71   Temp 98.3 F (36.8 C)   Ht 5\' 10"  (1.778 m)   Wt 156 lb (70.8 kg)   SpO2 94%   BMI 22.38  kg/m    Physical Exam Vitals and nursing note reviewed.  Constitutional:      General: He is not in acute distress.    Appearance: He is not ill-appearing, toxic-appearing or diaphoretic.  HENT:     Head: Normocephalic.     Right Ear: Tympanic membrane, ear canal and external ear normal.     Left Ear: Tympanic membrane, ear canal and external ear normal.     Nose: Nose normal.     Mouth/Throat:     Mouth: Mucous membranes are moist.     Pharynx: Oropharynx is clear.  Eyes:     Extraocular Movements: Extraocular movements intact.     Conjunctiva/sclera: Conjunctivae normal.     Pupils: Pupils are equal, round, and reactive to light.  Neck:     Thyroid: No thyroid mass, thyromegaly or thyroid tenderness.     Vascular: No carotid bruit.  Cardiovascular:     Rate and Rhythm: Normal rate and regular rhythm.     Pulses: Normal pulses.     Heart sounds: Normal heart sounds. No murmur heard.    No friction rub. No gallop.  Pulmonary:  Effort: Pulmonary effort is normal.     Breath sounds: Normal breath sounds.  Abdominal:     General: Bowel sounds are normal. There is no distension.     Palpations: Abdomen is soft. There is no mass.     Tenderness: There is no abdominal tenderness. There is no guarding.  Musculoskeletal:     Cervical back: Neck supple. No rigidity or tenderness.     Right lower leg: No edema.     Left lower leg: No edema.  Lymphadenopathy:     Cervical: No cervical adenopathy.  Skin:    General: Skin is warm and dry.     Capillary Refill: Capillary refill takes less than 2 seconds.     Findings: No lesion or rash.  Neurological:     Mental Status: He is alert and oriented to person, place, and time. Mental status is at baseline.     Cranial Nerves: No cranial nerve deficit.     Motor: No weakness.  Psychiatric:        Mood and Affect: Mood normal.        Behavior: Behavior normal.      No results found for any visits on 11/04/23.     Assessment &  Plan:    Routine Health Maintenance and Physical Exam  Jeffey was seen today for annual exam.  Diagnoses and all orders for this visit:  Encounter for general adult medical examination with abnormal findings  Vitamin D deficiency -     VITAMIN D 25 Hydroxy (Vit-D Deficiency, Fractures)  Chronic constipation Failed lactulose, miralax, fiber, hydration. Discussed amitiza as below. Return to office for new or worsening symptoms, or if symptoms persist.  -     lubiprostone (AMITIZA) 24 MCG capsule; Take 1 capsule (24 mcg total) by mouth 2 (two) times daily with a meal.  Cerebral palsy, unspecified type (HCC)  Partial symptomatic epilepsy with complex partial seizures, not intractable, without status epilepticus (HCC) Managed by neurology. Will check labs as below.  -     CBC with Differential/Platelet -     CMP14+EGFR -     TSH  Encounter for vision screening -     Ambulatory referral to Ophthalmology     Immunization History  Administered Date(s) Administered   Hepatitis B 01/02/2004   Influenza Inj Mdck Quad Pf 08/21/2019   Influenza, Seasonal, Injecte, Preservative Fre 08/08/2023   Influenza,inj,Quad PF,6+ Mos 07/28/2016, 07/25/2017, 10/18/2018, 08/04/2021   Influenza-Unspecified 06/22/2020   Moderna SARS-COV2 Booster Vaccination 11/26/2020   Moderna Sars-Covid-2 Vaccination 12/13/2019, 01/10/2020   Td 01/02/2004, 10/23/2019    Health Maintenance  Topic Date Due   COVID-19 Vaccine (3 - Moderna risk series) 11/20/2023 (Originally 12/24/2020)   DTaP/Tdap/Td (3 - Tdap) 10/22/2029   INFLUENZA VACCINE  Completed   Hepatitis C Screening  Completed   HIV Screening  Completed   HPV VACCINES  Aged Out    Discussed health benefits of physical activity, and encouraged him to engage in regular exercise appropriate for his age and condition.  Problem List Items Addressed This Visit       Digestive   Chronic constipation   Relevant Medications   lubiprostone (AMITIZA) 24  MCG capsule     Nervous and Auditory   Cerebral palsy, unspecified type (HCC)   Partial symptomatic epilepsy with complex partial seizures, not intractable, without status epilepticus (HCC)   Relevant Orders   CBC with Differential/Platelet   CMP14+EGFR   TSH     Other  Vitamin D deficiency   Relevant Orders   VITAMIN D 25 Hydroxy (Vit-D Deficiency, Fractures)   Other Visit Diagnoses       Encounter for general adult medical examination with abnormal findings    -  Primary     Encounter for vision screening       Relevant Orders   Ambulatory referral to Ophthalmology      Return in about 1 year (around 11/03/2024) for CPE.   The patient indicates understanding of these issues and agrees with the plan.  Gabriel Earing, FNP

## 2023-11-04 NOTE — Patient Instructions (Signed)
Health Maintenance, Male  Adopting a healthy lifestyle and getting preventive care are important in promoting health and wellness. Ask your health care provider about:  The right schedule for you to have regular tests and exams.  Things you can do on your own to prevent diseases and keep yourself healthy.  What should I know about diet, weight, and exercise?  Eat a healthy diet    Eat a diet that includes plenty of vegetables, fruits, low-fat dairy products, and lean protein.  Do not eat a lot of foods that are high in solid fats, added sugars, or sodium.  Maintain a healthy weight  Body mass index (BMI) is a measurement that can be used to identify possible weight problems. It estimates body fat based on height and weight. Your health care provider can help determine your BMI and help you achieve or maintain a healthy weight.  Get regular exercise  Get regular exercise. This is one of the most important things you can do for your health. Most adults should:  Exercise for at least 150 minutes each week. The exercise should increase your heart rate and make you sweat (moderate-intensity exercise).  Do strengthening exercises at least twice a week. This is in addition to the moderate-intensity exercise.  Spend less time sitting. Even light physical activity can be beneficial.  Watch cholesterol and blood lipids  Have your blood tested for lipids and cholesterol at 37 years of age, then have this test every 5 years.  You may need to have your cholesterol levels checked more often if:  Your lipid or cholesterol levels are high.  You are older than 37 years of age.  You are at high risk for heart disease.  What should I know about cancer screening?  Many types of cancers can be detected early and may often be prevented. Depending on your health history and family history, you may need to have cancer screening at various ages. This may include screening for:  Colorectal cancer.  Prostate cancer.  Skin cancer.  Lung  cancer.  What should I know about heart disease, diabetes, and high blood pressure?  Blood pressure and heart disease  High blood pressure causes heart disease and increases the risk of stroke. This is more likely to develop in people who have high blood pressure readings or are overweight.  Talk with your health care provider about your target blood pressure readings.  Have your blood pressure checked:  Every 3-5 years if you are 9-95 years of age.  Every year if you are 85 years old or older.  If you are between the ages of 29 and 29 and are a current or former smoker, ask your health care provider if you should have a one-time screening for abdominal aortic aneurysm (AAA).  Diabetes  Have regular diabetes screenings. This checks your fasting blood sugar level. Have the screening done:  Once every three years after age 23 if you are at a normal weight and have a low risk for diabetes.  More often and at a younger age if you are overweight or have a high risk for diabetes.  What should I know about preventing infection?  Hepatitis B  If you have a higher risk for hepatitis B, you should be screened for this virus. Talk with your health care provider to find out if you are at risk for hepatitis B infection.  Hepatitis C  Blood testing is recommended for:  Everyone born from [birth year] through 1964.  Anyone  with known risk factors for hepatitis C.  Sexually transmitted infections (STIs)  You should be screened each year for STIs, including gonorrhea and chlamydia, if:  You are sexually active and are younger than 37 years of age.  You are older than 37 years of age and your health care provider tells you that you are at risk for this type of infection.  Your sexual activity has changed since you were last screened, and you are at increased risk for chlamydia or gonorrhea. Ask your health care provider if you are at risk.  Ask your health care provider about whether you are at high risk for HIV. Your health care provider  may recommend a prescription medicine to help prevent HIV infection. If you choose to take medicine to prevent HIV, you should first get tested for HIV. You should then be tested every 3 months for as long as you are taking the medicine.  Follow these instructions at home:  Alcohol use  Do not drink alcohol if your health care provider tells you not to drink.  If you drink alcohol:  Limit how much you have to 0-2 drinks a day.  Know how much alcohol is in your drink. In the [place] one drink equals one 12 oz bottle of beer (355 mL), one 5 oz glass of wine (148 mL), or one 1 oz glass of hard liquor (44 mL).  Lifestyle  Do not use any products that contain nicotine or tobacco. These products include cigarettes, chewing tobacco, and vaping devices, such as e-cigarettes. If you need help quitting, ask your health care provider.  Do not use street drugs.  Do not share needles.  Ask your health care provider for help if you need support or information about quitting drugs.  General instructions  Schedule regular health, dental, and eye exams.  Stay current with your vaccines.  Tell your health care provider if:  You often feel depressed.  You have ever been abused or do not feel safe at home.  Summary  Adopting a healthy lifestyle and getting preventive care are important in promoting health and wellness.  Follow your health care provider's instructions about healthy diet, exercising, and getting tested or screened for diseases.  Follow your health care provider's instructions on monitoring your cholesterol and blood pressure.  This information is not intended to replace advice given to you by your health care provider. Make sure you discuss any questions you have with your health care provider.  Document Revised: 01/26/2021 Document Reviewed: 01/26/2021  Elsevier Patient Education  2024 [employer].

## 2023-11-05 LAB — CMP14+EGFR
ALT: 34 [IU]/L (ref 0–44)
AST: 20 [IU]/L (ref 0–40)
Albumin: 4.9 g/dL (ref 4.1–5.1)
Alkaline Phosphatase: 130 [IU]/L — ABNORMAL HIGH (ref 44–121)
BUN/Creatinine Ratio: 17 (ref 9–20)
BUN: 18 mg/dL (ref 6–20)
Bilirubin Total: 0.5 mg/dL (ref 0.0–1.2)
CO2: 25 mmol/L (ref 20–29)
Calcium: 10.4 mg/dL — ABNORMAL HIGH (ref 8.7–10.2)
Chloride: 97 mmol/L (ref 96–106)
Creatinine, Ser: 1.07 mg/dL (ref 0.76–1.27)
Globulin, Total: 3.1 g/dL (ref 1.5–4.5)
Glucose: 89 mg/dL (ref 70–99)
Potassium: 4.6 mmol/L (ref 3.5–5.2)
Sodium: 138 mmol/L (ref 134–144)
Total Protein: 8 g/dL (ref 6.0–8.5)
eGFR: 92 mL/min/{1.73_m2} (ref >59)

## 2023-11-05 LAB — CBC WITH DIFFERENTIAL/PLATELET
Basophils Absolute: 0.1 10*3/uL (ref 0.0–0.2)
Basos: 1 %
EOS (ABSOLUTE): 0.1 10*3/uL (ref 0.0–0.4)
Eos: 1 %
Hematocrit: 46.6 % (ref 37.5–51.0)
Hemoglobin: 16.2 g/dL (ref 13.0–17.7)
Immature Grans (Abs): 0 10*3/uL (ref 0.0–0.1)
Immature Granulocytes: 0 %
Lymphocytes Absolute: 2.3 10*3/uL (ref 0.7–3.1)
Lymphs: 25 %
MCH: 31 pg (ref 26.6–33.0)
MCHC: 34.8 g/dL (ref 31.5–35.7)
MCV: 89 fL (ref 79–97)
Monocytes Absolute: 0.8 10*3/uL (ref 0.1–0.9)
Monocytes: 9 %
Neutrophils Absolute: 5.9 10*3/uL (ref 1.4–7.0)
Neutrophils: 64 %
Platelets: 368 10*3/uL (ref 150–450)
RBC: 5.23 x10E6/uL (ref 4.14–5.80)
RDW: 13.4 % (ref 11.6–15.4)
WBC: 9.2 10*3/uL (ref 3.4–10.8)

## 2023-11-05 LAB — TSH: TSH: 1.91 u[IU]/mL (ref 0.450–4.500)

## 2023-11-05 LAB — VITAMIN D 25 HYDROXY (VIT D DEFICIENCY, FRACTURES): Vit D, 25-Hydroxy: 109 ng/mL — ABNORMAL HIGH (ref 30.0–100.0)

## 2023-11-07 ENCOUNTER — Encounter: Payer: Medicaid Other | Admitting: Family Medicine

## 2023-11-09 ENCOUNTER — Encounter: Payer: Self-pay | Admitting: Family Medicine

## 2023-11-26 ENCOUNTER — Other Ambulatory Visit: Payer: Self-pay | Admitting: Family Medicine

## 2023-11-26 DIAGNOSIS — E559 Vitamin D deficiency, unspecified: Secondary | ICD-10-CM

## 2023-11-29 ENCOUNTER — Other Ambulatory Visit: Payer: Self-pay | Admitting: Neurology

## 2023-11-29 DIAGNOSIS — G40209 Localization-related (focal) (partial) symptomatic epilepsy and epileptic syndromes with complex partial seizures, not intractable, without status epilepticus: Secondary | ICD-10-CM

## 2023-11-29 NOTE — Telephone Encounter (Signed)
 Last seen 12/08/22, next appt 12/08/23, refused refill since dispensed today not eligible for fill until 12/30/23  Dispenses   Dispensed Days Supply Quantity Provider Pharmacy  BRIVIACT 100MG  TAB 11/29/2023 30 60 tablet Windell Norfolk, MD [facility]  BRIVIACT 100MG  TAB 10/31/2023 30 60 tablet Windell Norfolk, MD [facility]  BRIVIACT 100MG  TAB 10/02/2023 30 60 tablet Windell Norfolk, MD [facility]  BRIVIACT 100MG  TAB 09/01/2023 30 60 tablet Windell Norfolk, MD [facility]  BRIVIACT 100MG  TAB 08/04/2023 30 60 tablet Windell Norfolk, MD [facility]  BRIVIACT 100MG  TAB 07/06/2023 30 60 tablet Windell Norfolk, MD [facility]  BRIVIACT 100MG  TAB 06/07/2023 30 60 tablet Gabriel Earing, FNP [facility]  BRIVIACT 100MG  TAB 05/09/2023 30 60 tablet Windell Norfolk, MD [facility]  BRIVIACT 100MG  TAB 04/11/2023 30 60 tablet Windell Norfolk, MD [facility]  BRIVIACT 100MG  TAB 03/11/2023 30 60 tablet Windell Norfolk, MD [facility]  BRIVIACT 100MG  TAB 02/10/2023 30 60 tablet Windell Norfolk, MD [facility]  BRIVIACT 100MG  TAB 01/14/2023 30 60 tablet Windell Norfolk, MD [facility]  BRIVIACT 100MG  TAB 12/08/2022 30 60 tablet Windell Norfolk, MD [facility].Marland KitchenMarland Kitchen

## 2023-12-07 NOTE — Progress Notes (Unsigned)
 [facility]  PATIENT: Colton Bruce DOB: [birth year]  REQUESTING CLINICIAN: Gabriel Earing, FNP HISTORY FROM: Patient, grandparents and chart review  REASON FOR VISIT: Epilepsy, here to establish care   HISTORICAL  CHIEF COMPLAINT:  No chief complaint on file.   HISTORY OF PRESENT ILLNESS:    Update 12/08/2023 JM: Patient returns for yearly follow-up visit.  Remains on Briviact 100 mg twice daily and lamotrigine XR 400 mg daily, denies side effects.  No recurrent seizure activity.     Consult visit 12/08/2022 Dr. Teresa Coombs: This 38 year old gentleman past medical history of cerebral palsy, seizures who is presenting with both grandparents to establish care.  Patient was previously well managed by Dr. Ronal Fear office.  For his seizure he is on Briviact 100 mg twice daily and lamotrigine XR 400 mg daily.  He has not had a seizure for the past 4 years. His seizures were described as lipsmacking and hand rubbing with unresponsiveness.  Grand parents denies any generalized convulsion.  Overall he is doing very well.  Currently no complaints or concerns.  Handedness: Right   Onset: All his life   Seizure Type: lipsmacking, hand automatism and unresponsiveness  Current frequency: None for the past 4 years  Any injuries from seizures: Denies  Seizure risk factors: History of prematurity, developmental delay, cerebral palsy   Previous ASMs: Tegretol, Lamotrigine   Currenty ASMs: Briviact 100 mg BID and Lamotrigine XR 400 mg daily   ASMs side effects: Denies   Brain Images: None available for review   Previous EEGs: None available for review    OTHER MEDICAL CONDITIONS: Seizure disorder, cerebral palsy, development delay   REVIEW OF SYSTEMS: Full 14 system review of systems performed and negative with exception of: as noted in the HPI   ALLERGIES: Allergies  Allergen Reactions   Penicillins Other (See Comments)    seizure    HOME  MEDICATIONS: Outpatient Medications Prior to Visit  Medication Sig Dispense Refill   brivaracetam (BRIVIACT) 100 MG TABS tablet Take 1 tablet (100 mg total) by mouth 2 (two) times daily. 60 tablet 5   LamoTRIgine 200 MG TB24 24 hour tablet Take 2 tablets (400 mg total) by mouth daily. 60 tablet 11   lubiprostone (AMITIZA) 24 MCG capsule Take 1 capsule (24 mcg total) by mouth 2 (two) times daily with a meal. 90 capsule 3   polyethylene glycol (MIRALAX / GLYCOLAX) 17 g packet Take 17 g by mouth every other day.     Vitamin D, Ergocalciferol, (DRISDOL) 1.25 MG (50000 UNIT) CAPS capsule Take 1 capsule (50,000 Units total) by mouth every 7 (seven) days. 12 capsule 3   No facility-administered medications prior to visit.    PAST MEDICAL HISTORY: Past Medical History:  Diagnosis Date   Mental retardation    Seizures (HCC)     PAST SURGICAL HISTORY: Past Surgical History:  Procedure Laterality Date   FINGER SURGERY      FAMILY HISTORY: Family History  Problem Relation Age of Onset   COPD Paternal Grandmother    Heart disease Paternal Grandfather    Hyperlipidemia Paternal Grandfather     SOCIAL HISTORY: Social History   Socioeconomic History   Marital status: Single    Spouse name: Not on file   Number of children: Not on file   Years of education: Not on file   Highest education level: Not on file  Occupational History   Not on file  Tobacco Use   Smoking status: Never  Smokeless tobacco: Never  Substance and Sexual Activity   Alcohol use: No   Drug use: No   Sexual activity: Not on file  Other Topics Concern   Not on file  Social History Narrative   Not on file   Social Drivers of Health   Financial Resource Strain: Not on file  Food Insecurity: No Food Insecurity (11/04/2023)   Hunger Vital Sign    Worried About Running Out of Food in the Last Year: Never true    Ran Out of Food in the Last Year: Never true  Transportation Needs: Not on file  Physical  Activity: Not on file  Stress: Not on file  Social Connections: Not on file  Intimate Partner Violence: Not on file    PHYSICAL EXAM  GENERAL EXAM/CONSTITUTIONAL: Vitals:  There were no vitals filed for this visit.  There is no height or weight on file to calculate BMI. Wt Readings from Last 3 Encounters:  11/04/23 156 lb (70.8 kg)  12/08/22 155 lb 8 oz (70.5 kg)  11/04/22 156 lb 2 oz (70.8 kg)   Patient is in no distress; well developed, nourished and groomed; neck is supple  EYES: Visual fields full to confrontation, Extraocular movements intacts,  No results found.  MUSCULOSKELETAL: Gait, strength, tone, movements noted in Neurologic exam below  NEUROLOGIC: MENTAL STATUS:      No data to display         awake, alert, oriented to person, place but not date Able to answer simple questions, able to name, repeat and comprehend  Difficulty with calculating the number of quarter in $1 and $2.   CRANIAL NERVE:  2nd, 3rd, 4th, 6th - Visual fields full to confrontation, extraocular muscles intact, no nystagmus 5th - facial sensation symmetric 7th - facial strength symmetric 8th - hearing intact 9th - palate elevates symmetrically, uvula midline 11th - shoulder shrug symmetric 12th - tongue protrusion midline  MOTOR:  normal bulk and tone, full strength in the BUE, BLE  SENSORY:  normal and symmetric to light touch  COORDINATION:  finger-nose-finger, fine finger movements normal  REFLEXES:  deep tendon reflexes present and symmetric  GAIT/STATION:  normal     DIAGNOSTIC DATA (LABS, IMAGING, TESTING) - I reviewed patient records, labs, notes, testing and imaging myself where available.  Lab Results  Component Value Date   WBC 9.2 11/04/2023   HGB 16.2 11/04/2023   HCT 46.6 11/04/2023   MCV 89 11/04/2023   PLT 368 11/04/2023      Component Value Date/Time   NA 138 11/04/2023 1118   K 4.6 11/04/2023 1118   CL 97 11/04/2023 1118   CO2 25  11/04/2023 1118   GLUCOSE 89 11/04/2023 1118   BUN 18 11/04/2023 1118   CREATININE 1.07 11/04/2023 1118   CALCIUM 10.4 (H) 11/04/2023 1118   PROT 8.0 11/04/2023 1118   ALBUMIN 4.9 11/04/2023 1118   AST 20 11/04/2023 1118   ALT 34 11/04/2023 1118   ALKPHOS 130 (H) 11/04/2023 1118   BILITOT 0.5 11/04/2023 1118   GFRNONAA 86 10/23/2020 1027   GFRAA 100 10/23/2020 1027   Lab Results  Component Value Date   CHOL 175 11/04/2022   HDL 43 11/04/2022   LDLCALC 114 (H) 11/04/2022   TRIG 100 11/04/2022   No results found for: "HGBA1C" No results found for: "VITAMINB12" Lab Results  Component Value Date   TSH 1.910 11/04/2023    ASSESSMENT AND PLAN  38 y.o. year old male  with  history of epilepsy, cerebral palsy, developmental delay who is being followed for seizure management.  Patient epilepsy is well-maintained on lamotrigine XR 400 mg daily and Briviact 100 mg twice daily.  His last seizure was more than 4 years ago.  At the moment we will continue current medications.  I will check a lamotrigine level and patient will follow-up on a yearly basis.  Advised grandparents to contact me if he has a breakthrough seizure.  They voiced understanding.   No diagnosis found.        I spent *** minutes of face-to-face and non-face-to-face time with patient.  This included previsit chart review, lab review, study review, order entry, electronic health record documentation, patient education and discussion regarding above diagnoses and treatment plan and answered all other questions to patient's satisfaction  Ihor Austin, [facility]  [facility] [address] [place], [place] [address]  Phone [phone] Fax [phone] Note: This document was prepared with digital dictation and possible smart phrase technology. Any transcriptional errors that result from this process are unintentional.

## 2023-12-08 ENCOUNTER — Encounter: Payer: Self-pay | Admitting: Adult Health

## 2023-12-08 ENCOUNTER — Ambulatory Visit: Payer: Medicaid Other | Admitting: Adult Health

## 2023-12-08 VITALS — BP 118/67 | HR 76 | Ht 70.0 in | Wt 158.0 lb

## 2023-12-08 DIAGNOSIS — Z5181 Encounter for therapeutic drug level monitoring: Secondary | ICD-10-CM | POA: Diagnosis not present

## 2023-12-08 DIAGNOSIS — G40209 Localization-related (focal) (partial) symptomatic epilepsy and epileptic syndromes with complex partial seizures, not intractable, without status epilepticus: Secondary | ICD-10-CM | POA: Diagnosis not present

## 2023-12-08 MED ORDER — LAMOTRIGINE ER 200 MG PO TB24: 400.0000 mg | ORAL_TABLET | Freq: Every day | ORAL | 3 refills | Status: AC

## 2023-12-08 NOTE — Patient Instructions (Addendum)
 Your Plan:  Continue lamotrigine XR 400 mg daily and Briviact 100 mg twice daily for seizure prevention  Lamotrigine level checked today - you will see results on MyChart. I will reach out to you with any abnormal level.  Please call with any seizure activity     Follow-up in 1 year or call earlier if needed      Thank you for coming to see [place] at [facility]. I hope we have been able to provide you high quality care today.  You may receive a patient satisfaction survey over the next few weeks. We would appreciate your feedback and comments so that we may continue to improve ourselves and the health of our patients.

## 2023-12-09 LAB — LAMOTRIGINE LEVEL: Lamotrigine Lvl: 13.9 ug/mL (ref 2.0–20.0)

## 2023-12-14 ENCOUNTER — Telehealth: Payer: Self-pay

## 2023-12-14 NOTE — Telephone Encounter (Signed)
 Results given to patsy and she voiced understanding

## 2023-12-14 NOTE — Telephone Encounter (Signed)
-----   Message from Ihor Austin sent at 12/10/2023  3:48 PM EDT ----- Please advise patient/grandparents that recent lamotrigine level looked good. Please continue on current regimen. Thank you.

## 2023-12-20 ENCOUNTER — Other Ambulatory Visit: Payer: Self-pay | Admitting: Neurology

## 2023-12-20 DIAGNOSIS — G40209 Localization-related (focal) (partial) symptomatic epilepsy and epileptic syndromes with complex partial seizures, not intractable, without status epilepticus: Secondary | ICD-10-CM

## 2023-12-21 NOTE — Telephone Encounter (Signed)
 Last seen 12/08/23 Next appt 12/06/24  Dispenses   Dispensed Days Supply Quantity Provider Pharmacy  BRIVIACT 100MG  TAB 11/29/2023 30 60 tablet Windell Norfolk, MD [facility]  BRIVIACT 100MG  TAB 10/31/2023 30 60 tablet Windell Norfolk, MD [facility]  BRIVIACT 100MG  TAB 10/02/2023 30 60 tablet Windell Norfolk, MD [facility]  BRIVIACT 100MG  TAB 09/01/2023 30 60 tablet Windell Norfolk, MD [facility]  BRIVIACT 100MG  TAB 08/04/2023 30 60 tablet Windell Norfolk, MD [facility]  BRIVIACT 100MG  TAB 07/06/2023 30 60 tablet Windell Norfolk, MD [facility]  BRIVIACT 100MG  TAB 06/07/2023 30 60 tablet Gabriel Earing, FNP [facility]  BRIVIACT 100MG  TAB 05/09/2023 30 60 tablet Windell Norfolk, MD [facility]  BRIVIACT 100MG  TAB 04/11/2023 30 60 tablet Windell Norfolk, MD [facility]  BRIVIACT 100MG  TAB 03/11/2023 30 60 tablet Windell Norfolk, MD [facility]  BRIVIACT 100MG  TAB 02/10/2023 30 60 tablet Windell Norfolk, MD [facility]  BRIVIACT 100MG  TAB 01/14/2023 30 60 tablet Windell Norfolk, MD [facility].Marland KitchenMarland Kitchen

## 2024-06-16 ENCOUNTER — Other Ambulatory Visit: Payer: Self-pay | Admitting: Neurology

## 2024-06-16 DIAGNOSIS — G40209 Localization-related (focal) (partial) symptomatic epilepsy and epileptic syndromes with complex partial seizures, not intractable, without status epilepticus: Secondary | ICD-10-CM

## 2024-06-18 NOTE — Telephone Encounter (Signed)
 Requested Prescriptions   Pending Prescriptions Disp Refills   BRIVIACT  100 MG TABS tablet [Pharmacy Med Name: BRIVIACT  100 MG TAB] 60 tablet     Sig: TAKE ONE (1) TABLET BY MOUTH TWO (2) TIMES DAILY   Last seen 12/08/23 Next appt 12/06/24 Dispenses   Dispensed Days Supply Quantity Provider Pharmacy  BRIVIACT      TAB 100MG  05/21/2024 30 60 tablet Camara, Amadou, MD [facility]...  BRIVIACT      TAB 100MG  04/20/2024 30 60 tablet Camara, Amadou, MD [facility]...  BRIVIACT      TAB 100MG  03/22/2024 30 60 tablet Camara, Amadou, MD [facility]...  BRIVIACT      TAB 100MG  02/23/2024 30 60 tablet Camara, Amadou, MD [facility]...  BRIVIACT      TAB 100MG  01/25/2024 30 60 tablet Camara, Amadou, MD [facility]...  BRIVIACT      TAB 100MG  12/26/2023 30 60 tablet Camara, Amadou, MD [facility]...  BRIVIACT      TAB 100MG  11/29/2023 30 60 tablet Camara, Amadou, MD [facility]...  BRIVIACT      TAB 100MG  10/31/2023 30 60 tablet Camara, Amadou, MD [facility]...  BRIVIACT      TAB 100MG  10/02/2023 30 60 tablet Camara, Amadou, MD [facility]...  BRIVIACT      TAB 100MG  09/01/2023 30 60 tablet Camara, Amadou, MD [facility]...  BRIVIACT      TAB 100MG  08/04/2023 30 60 tablet Camara, Amadou, MD [facility]  BRIVIACT      TAB 100MG  07/06/2023 30 60 tablet Camara, Amadou, MD [facility].[email or web address]

## 2024-07-12 ENCOUNTER — Ambulatory Visit (INDEPENDENT_AMBULATORY_CARE_PROVIDER_SITE_OTHER)

## 2024-07-12 DIAGNOSIS — Z23 Encounter for immunization: Secondary | ICD-10-CM | POA: Diagnosis not present

## 2024-11-05 ENCOUNTER — Encounter: Payer: Medicaid Other | Admitting: Family Medicine

## 2024-12-06 ENCOUNTER — Ambulatory Visit: Admitting: Adult Health
